# Patient Record
Sex: Male | Born: 1980 | Race: Black or African American | Hispanic: No | Marital: Single | Smoking: Former smoker
Health system: Southern US, Community
[De-identification: ages and names within clinical notes are randomized; demographics above are authoritative.]

## PROBLEM LIST (undated history)

## (undated) DIAGNOSIS — K219 Gastro-esophageal reflux disease without esophagitis: Secondary | ICD-10-CM

## (undated) DIAGNOSIS — K56609 Unspecified intestinal obstruction, unspecified as to partial versus complete obstruction: Secondary | ICD-10-CM

## (undated) DIAGNOSIS — W3400XA Accidental discharge from unspecified firearms or gun, initial encounter: Secondary | ICD-10-CM

## (undated) DIAGNOSIS — Y249XXA Unspecified firearm discharge, undetermined intent, initial encounter: Secondary | ICD-10-CM

## (undated) HISTORY — PX: SMALL INTESTINE SURGERY: SHX150

## (undated) HISTORY — PX: OTHER SURGICAL HISTORY: SHX169

---

## 2000-10-04 ENCOUNTER — Encounter: Payer: Self-pay | Admitting: Emergency Medicine

## 2000-10-04 ENCOUNTER — Observation Stay (HOSPITAL_COMMUNITY): Admission: EM | Admit: 2000-10-04 | Discharge: 2000-10-05 | Payer: Self-pay | Admitting: Emergency Medicine

## 2004-04-01 ENCOUNTER — Emergency Department (HOSPITAL_COMMUNITY): Admission: EM | Admit: 2004-04-01 | Discharge: 2004-04-01 | Payer: Self-pay | Admitting: Emergency Medicine

## 2004-10-05 ENCOUNTER — Emergency Department (HOSPITAL_COMMUNITY): Admission: EM | Admit: 2004-10-05 | Discharge: 2004-10-05 | Payer: Self-pay | Admitting: Emergency Medicine

## 2004-10-07 ENCOUNTER — Emergency Department (HOSPITAL_COMMUNITY): Admission: EM | Admit: 2004-10-07 | Discharge: 2004-10-07 | Payer: Self-pay | Admitting: Emergency Medicine

## 2005-02-11 ENCOUNTER — Emergency Department (HOSPITAL_COMMUNITY): Admission: EM | Admit: 2005-02-11 | Discharge: 2005-02-11 | Payer: Self-pay | Admitting: Emergency Medicine

## 2005-02-13 ENCOUNTER — Emergency Department (HOSPITAL_COMMUNITY): Admission: EM | Admit: 2005-02-13 | Discharge: 2005-02-13 | Payer: Self-pay | Admitting: Emergency Medicine

## 2005-02-15 ENCOUNTER — Emergency Department (HOSPITAL_COMMUNITY): Admission: EM | Admit: 2005-02-15 | Discharge: 2005-02-15 | Payer: Self-pay | Admitting: Emergency Medicine

## 2005-03-02 ENCOUNTER — Emergency Department (HOSPITAL_COMMUNITY): Admission: EM | Admit: 2005-03-02 | Discharge: 2005-03-02 | Payer: Self-pay | Admitting: Emergency Medicine

## 2005-03-21 ENCOUNTER — Emergency Department (HOSPITAL_COMMUNITY): Admission: EM | Admit: 2005-03-21 | Discharge: 2005-03-21 | Payer: Self-pay | Admitting: Emergency Medicine

## 2005-03-29 ENCOUNTER — Emergency Department (HOSPITAL_COMMUNITY): Admission: EM | Admit: 2005-03-29 | Discharge: 2005-03-29 | Payer: Self-pay | Admitting: Emergency Medicine

## 2010-04-13 ENCOUNTER — Emergency Department (HOSPITAL_COMMUNITY)
Admission: EM | Admit: 2010-04-13 | Discharge: 2010-04-13 | Payer: Self-pay | Source: Home / Self Care | Admitting: Emergency Medicine

## 2010-08-23 LAB — RAPID STREP SCREEN (MED CTR MEBANE ONLY): Streptococcus, Group A Screen (Direct): NEGATIVE

## 2013-05-25 ENCOUNTER — Emergency Department (HOSPITAL_COMMUNITY)
Admission: EM | Admit: 2013-05-25 | Discharge: 2013-05-25 | Disposition: A | Discharge status: Home / Self Care | Payer: Medicaid Other | Attending: Emergency Medicine | Admitting: Emergency Medicine

## 2013-05-25 ENCOUNTER — Encounter (HOSPITAL_COMMUNITY): Payer: Self-pay | Admitting: Emergency Medicine

## 2013-05-25 ENCOUNTER — Emergency Department (HOSPITAL_COMMUNITY): Payer: Medicaid Other

## 2013-05-25 DIAGNOSIS — Z9889 Other specified postprocedural states: Secondary | ICD-10-CM | POA: Insufficient documentation

## 2013-05-25 DIAGNOSIS — R1032 Left lower quadrant pain: Secondary | ICD-10-CM | POA: Insufficient documentation

## 2013-05-25 DIAGNOSIS — R112 Nausea with vomiting, unspecified: Secondary | ICD-10-CM | POA: Insufficient documentation

## 2013-05-25 DIAGNOSIS — Z87828 Personal history of other (healed) physical injury and trauma: Secondary | ICD-10-CM | POA: Insufficient documentation

## 2013-05-25 DIAGNOSIS — G8918 Other acute postprocedural pain: Secondary | ICD-10-CM | POA: Insufficient documentation

## 2013-05-25 DIAGNOSIS — Z79899 Other long term (current) drug therapy: Secondary | ICD-10-CM | POA: Insufficient documentation

## 2013-05-25 DIAGNOSIS — R1012 Left upper quadrant pain: Secondary | ICD-10-CM | POA: Insufficient documentation

## 2013-05-25 DIAGNOSIS — F17200 Nicotine dependence, unspecified, uncomplicated: Secondary | ICD-10-CM | POA: Insufficient documentation

## 2013-05-25 DIAGNOSIS — K5900 Constipation, unspecified: Secondary | ICD-10-CM | POA: Insufficient documentation

## 2013-05-25 HISTORY — DX: Unspecified intestinal obstruction, unspecified as to partial versus complete obstruction: K56.609

## 2013-05-25 HISTORY — DX: Accidental discharge from unspecified firearms or gun, initial encounter: W34.00XA

## 2013-05-25 HISTORY — DX: Unspecified firearm discharge, undetermined intent, initial encounter: Y24.9XXA

## 2013-05-25 LAB — CBC WITH DIFFERENTIAL/PLATELET
Basophils Absolute: 0 10*3/uL (ref 0.0–0.1)
Basophils Relative: 0 % (ref 0–1)
Eosinophils Absolute: 0 10*3/uL (ref 0.0–0.7)
Eosinophils Relative: 0 % (ref 0–5)
HCT: 43.4 % (ref 39.0–52.0)
Hemoglobin: 14.5 g/dL (ref 13.0–17.0)
MCH: 28.9 pg (ref 26.0–34.0)
MCHC: 33.4 g/dL (ref 30.0–36.0)
MCV: 86.6 fL (ref 78.0–100.0)
Monocytes Absolute: 0.6 10*3/uL (ref 0.1–1.0)
Monocytes Relative: 6 % (ref 3–12)
Platelets: 560 10*3/uL — ABNORMAL HIGH (ref 150–400)
RBC: 5.01 MIL/uL (ref 4.22–5.81)
RDW: 13.6 % (ref 11.5–15.5)

## 2013-05-25 LAB — COMPREHENSIVE METABOLIC PANEL
AST: 18 U/L (ref 0–37)
Albumin: 3.8 g/dL (ref 3.5–5.2)
BUN: 12 mg/dL (ref 6–23)
CO2: 31 mEq/L (ref 19–32)
Calcium: 9.5 mg/dL (ref 8.4–10.5)
Chloride: 99 mEq/L (ref 96–112)
Creatinine, Ser: 1.14 mg/dL (ref 0.50–1.35)
GFR calc Af Amer: >90 mL/min (ref >90)
GFR calc non Af Amer: 83 mL/min — ABNORMAL LOW (ref >90)
Total Bilirubin: 0.4 mg/dL (ref 0.3–1.2)
Total Protein: 8.3 g/dL (ref 6.0–8.3)

## 2013-05-25 LAB — URINALYSIS, ROUTINE W REFLEX MICROSCOPIC
Bilirubin Urine: NEGATIVE
Ketones, ur: NEGATIVE mg/dL
Leukocytes, UA: NEGATIVE
Nitrite: NEGATIVE
Protein, ur: NEGATIVE mg/dL
Specific Gravity, Urine: 1.015 (ref 1.005–1.030)
Urobilinogen, UA: 0.2 mg/dL (ref 0.0–1.0)
pH: 8.5 — ABNORMAL HIGH (ref 5.0–8.0)

## 2013-05-25 LAB — LIPASE, BLOOD: Lipase: 35 U/L (ref 11–59)

## 2013-05-25 LAB — LACTIC ACID, PLASMA: Lactic Acid, Venous: 1 mmol/L (ref 0.5–2.2)

## 2013-05-25 MED ORDER — OXYCODONE-ACETAMINOPHEN 5-325 MG PO TABS
2.0000 | ORAL_TABLET | Freq: Once | ORAL | Status: AC
  Administered 2013-05-25: 2 via ORAL
  Filled 2013-05-25: qty 2

## 2013-05-25 MED ORDER — IOHEXOL 300 MG/ML  SOLN
100.0000 mL | Freq: Once | INTRAMUSCULAR | Status: AC | PRN
  Administered 2013-05-25: 100 mL via INTRAVENOUS

## 2013-05-25 MED ORDER — ONDANSETRON HCL 4 MG PO TABS: 4.0000 mg | ORAL_TABLET | Freq: Four times a day (QID) | ORAL | Status: DC

## 2013-05-25 MED ORDER — ONDANSETRON HCL 4 MG/2ML IJ SOLN
4.0000 mg | Freq: Once | INTRAMUSCULAR | Status: AC
  Administered 2013-05-25: 4 mg via INTRAVENOUS
  Filled 2013-05-25: qty 2

## 2013-05-25 MED ORDER — TRAMADOL HCL 50 MG PO TABS: 50.0000 mg | ORAL_TABLET | Freq: Four times a day (QID) | ORAL | Status: DC | PRN

## 2013-05-25 MED ORDER — HYDROMORPHONE HCL PF 1 MG/ML IJ SOLN
1.0000 mg | Freq: Once | INTRAMUSCULAR | Status: AC
  Administered 2013-05-25: 1 mg via INTRAVENOUS
  Filled 2013-05-25: qty 1

## 2013-05-25 MED ORDER — SODIUM CHLORIDE 0.9 % IJ SOLN
INTRAMUSCULAR | Status: AC
  Administered 2013-05-25: 17:00:00
  Filled 2013-05-25: qty 250

## 2013-05-25 MED ORDER — SODIUM CHLORIDE 0.9 % IV BOLUS (SEPSIS)
1000.0000 mL | Freq: Once | INTRAVENOUS | Status: AC
  Administered 2013-05-25: 1000 mL via INTRAVENOUS

## 2013-05-25 MED ORDER — IOHEXOL 300 MG/ML  SOLN
50.0000 mL | Freq: Once | INTRAMUSCULAR | Status: AC | PRN
  Administered 2013-05-25: 50 mL via ORAL
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
## 2013-05-25 NOTE — ED Notes (Addendum)
Per mother patient had small bowel obsturuction in which he had surgery for on 12/3, since then patient has had severe left upper and lower pain with nausea and vomiting. Patient reports taking norco with no relief. Per patient normal BMs but has had bright red blood in emesis. Patient seen at [facility], had x-ray and enema, was discharged. Surgeon refused to help him any further after patient called office reporting the pain and vomiting.

## 2014-05-09 ENCOUNTER — Emergency Department (HOSPITAL_COMMUNITY): Payer: Medicaid Other

## 2014-05-09 ENCOUNTER — Emergency Department (HOSPITAL_COMMUNITY)
Admission: EM | Admit: 2014-05-09 | Discharge: 2014-05-09 | Disposition: A | Discharge status: Home / Self Care | Payer: Medicaid Other | Attending: Emergency Medicine | Admitting: Emergency Medicine

## 2014-05-09 ENCOUNTER — Encounter (HOSPITAL_COMMUNITY): Payer: Self-pay | Admitting: Emergency Medicine

## 2014-05-09 DIAGNOSIS — Z8719 Personal history of other diseases of the digestive system: Secondary | ICD-10-CM | POA: Insufficient documentation

## 2014-05-09 DIAGNOSIS — R197 Diarrhea, unspecified: Secondary | ICD-10-CM | POA: Insufficient documentation

## 2014-05-09 DIAGNOSIS — R1084 Generalized abdominal pain: Secondary | ICD-10-CM | POA: Diagnosis not present

## 2014-05-09 DIAGNOSIS — Z9889 Other specified postprocedural states: Secondary | ICD-10-CM | POA: Insufficient documentation

## 2014-05-09 DIAGNOSIS — R109 Unspecified abdominal pain: Secondary | ICD-10-CM | POA: Diagnosis present

## 2014-05-09 DIAGNOSIS — R110 Nausea: Secondary | ICD-10-CM | POA: Diagnosis not present

## 2014-05-09 DIAGNOSIS — Z87828 Personal history of other (healed) physical injury and trauma: Secondary | ICD-10-CM | POA: Diagnosis not present

## 2014-05-09 DIAGNOSIS — Z79899 Other long term (current) drug therapy: Secondary | ICD-10-CM | POA: Diagnosis not present

## 2014-05-09 DIAGNOSIS — Z720 Tobacco use: Secondary | ICD-10-CM | POA: Diagnosis not present

## 2014-05-09 LAB — URINALYSIS, ROUTINE W REFLEX MICROSCOPIC
Bilirubin Urine: NEGATIVE
GLUCOSE, UA: NEGATIVE mg/dL
Hgb urine dipstick: NEGATIVE
KETONES UR: NEGATIVE mg/dL
LEUKOCYTES UA: NEGATIVE
Nitrite: NEGATIVE
Protein, ur: NEGATIVE mg/dL
SPECIFIC GRAVITY, URINE: 1.02 (ref 1.005–1.030)
Urobilinogen, UA: 0.2 mg/dL (ref 0.0–1.0)
pH: 7.5 (ref 5.0–8.0)

## 2014-05-09 LAB — BASIC METABOLIC PANEL
Anion gap: 10 (ref 5–15)
BUN: 13 mg/dL (ref 6–23)
CHLORIDE: 101 meq/L (ref 96–112)
CO2: 27 meq/L (ref 19–32)
Calcium: 9.2 mg/dL (ref 8.4–10.5)
Creatinine, Ser: 1.12 mg/dL (ref 0.50–1.35)
GFR calc Af Amer: >90 mL/min (ref >90)
GFR calc non Af Amer: 84 mL/min — ABNORMAL LOW (ref >90)
Glucose, Bld: 88 mg/dL (ref 70–99)
POTASSIUM: 4.3 meq/L (ref 3.7–5.3)
SODIUM: 138 meq/L (ref 137–147)

## 2014-05-09 LAB — CBC WITH DIFFERENTIAL/PLATELET
Basophils Absolute: 0.1 10*3/uL (ref 0.0–0.1)
Basophils Relative: 1 % (ref 0–1)
EOS PCT: 5 % (ref 0–5)
Eosinophils Absolute: 0.3 10*3/uL (ref 0.0–0.7)
HEMATOCRIT: 42.7 % (ref 39.0–52.0)
HEMOGLOBIN: 14.2 g/dL (ref 13.0–17.0)
Lymphocytes Relative: 37 % (ref 12–46)
Lymphs Abs: 2.3 10*3/uL (ref 0.7–4.0)
MCH: 29.1 pg (ref 26.0–34.0)
MCHC: 33.3 g/dL (ref 30.0–36.0)
MCV: 87.5 fL (ref 78.0–100.0)
MONO ABS: 0.6 10*3/uL (ref 0.1–1.0)
Monocytes Relative: 10 % (ref 3–12)
Neutro Abs: 2.9 10*3/uL (ref 1.7–7.7)
Neutrophils Relative %: 47 % (ref 43–77)
Platelets: 278 10*3/uL (ref 150–400)
RBC: 4.88 MIL/uL (ref 4.22–5.81)
RDW: 14.2 % (ref 11.5–15.5)
WBC: 6.1 10*3/uL (ref 4.0–10.5)

## 2014-05-09 MED ORDER — HYDROCODONE-ACETAMINOPHEN 5-325 MG PO TABS: 1.0000 | ORAL_TABLET | ORAL | Status: DC | PRN

## 2014-05-09 MED ORDER — SODIUM CHLORIDE 0.9 % IV BOLUS (SEPSIS)
1000.0000 mL | Freq: Once | INTRAVENOUS | Status: AC
  Administered 2014-05-09: 1000 mL via INTRAVENOUS

## 2014-05-09 MED ORDER — IOHEXOL 300 MG/ML  SOLN
100.0000 mL | Freq: Once | INTRAMUSCULAR | Status: AC | PRN
  Administered 2014-05-09: 100 mL via INTRAVENOUS

## 2014-05-09 MED ORDER — ONDANSETRON HCL 4 MG/2ML IJ SOLN
4.0000 mg | Freq: Once | INTRAMUSCULAR | Status: AC
  Administered 2014-05-09: 4 mg via INTRAVENOUS
  Filled 2014-05-09: qty 2

## 2014-05-09 MED ORDER — HYDROMORPHONE HCL 1 MG/ML IJ SOLN
1.0000 mg | Freq: Once | INTRAMUSCULAR | Status: AC
  Administered 2014-05-09: 1 mg via INTRAVENOUS
  Filled 2014-05-09: qty 1

## 2014-05-09 MED ORDER — IOHEXOL 300 MG/ML  SOLN
50.0000 mL | Freq: Once | INTRAMUSCULAR | Status: AC | PRN
  Administered 2014-05-09: 50 mL via ORAL

## 2014-05-09 NOTE — ED Provider Notes (Signed)
17:00- patient is comfortable at this time.  I discussed the CT findings with him.  Questionable referral to primary care, and is given appropriate discharge instructions.  Results for orders placed or performed during the hospital encounter of 05/09/14  Basic metabolic panel  Result Value Ref Range   Sodium 138 137 - 147 mEq/L   Potassium 4.3 3.7 - 5.3 mEq/L   Chloride 101 96 - 112 mEq/L   CO2 27 19 - 32 mEq/L   Glucose, Bld 88 70 - 99 mg/dL   BUN 13 6 - 23 mg/dL   Creatinine, Ser 1.611.12 0.50 - 1.35 mg/dL   Calcium 9.2 8.4 - 09.610.5 mg/dL   GFR calc non Af Amer 84 (L) >90 mL/min   GFR calc Af Amer >90 >90 mL/min   Anion gap 10 5 - 15  CBC with Differential  Result Value Ref Range   WBC 6.1 4.0 - 10.5 K/uL   RBC 4.88 4.22 - 5.81 MIL/uL   Hemoglobin 14.2 13.0 - 17.0 g/dL   HCT 04.542.7 40.939.0 - 81.152.0 %   MCV 87.5 78.0 - 100.0 fL   MCH 29.1 26.0 - 34.0 pg   MCHC 33.3 30.0 - 36.0 g/dL   RDW 91.414.2 78.211.5 - 95.615.5 %   Platelets 278 150 - 400 K/uL   Neutrophils Relative % 47 43 - 77 %   Neutro Abs 2.9 1.7 - 7.7 K/uL   Lymphocytes Relative 37 12 - 46 %   Lymphs Abs 2.3 0.7 - 4.0 K/uL   Monocytes Relative 10 3 - 12 %   Monocytes Absolute 0.6 0.1 - 1.0 K/uL   Eosinophils Relative 5 0 - 5 %   Eosinophils Absolute 0.3 0.0 - 0.7 K/uL   Basophils Relative 1 0 - 1 %   Basophils Absolute 0.1 0.0 - 0.1 K/uL  Urinalysis, Routine w reflex microscopic  Result Value Ref Range   Color, Urine YELLOW YELLOW   APPearance CLEAR CLEAR   Specific Gravity, Urine 1.020 1.005 - 1.030   pH 7.5 5.0 - 8.0   Glucose, UA NEGATIVE NEGATIVE mg/dL   Hgb urine dipstick NEGATIVE NEGATIVE   Bilirubin Urine NEGATIVE NEGATIVE   Ketones, ur NEGATIVE NEGATIVE mg/dL   Protein, ur NEGATIVE NEGATIVE mg/dL   Urobilinogen, UA 0.2 0.0 - 1.0 mg/dL   Nitrite NEGATIVE NEGATIVE   Leukocytes, UA NEGATIVE NEGATIVE    Ct Abdomen Pelvis W Contrast  05/09/2014   CLINICAL DATA:  33 year old with recurrent and severe abdominal pain. Nausea  and loose stools.  EXAM: CT ABDOMEN AND PELVIS WITH CONTRAST  TECHNIQUE: Multidetector CT imaging of the abdomen and pelvis was performed using the standard protocol following bolus administration of intravenous contrast.  CONTRAST:  50mL OMNIPAQUE IOHEXOL 300 MG/ML SOLN, 100mL OMNIPAQUE IOHEXOL 300 MG/ML SOLN  COMPARISON:  04/22/2014  FINDINGS: Lung bases are clear.  No evidence for free air.  Normal appearance of the liver, portal venous system and gallbladder. Normal appearance of the spleen and pancreas. Normal appearance of the adrenal glands. Two small stones in the right kidney upper pole without hydronephrosis. Largest stone roughly measures 3 mm. Small cyst along the right kidney upper pole. Two small stones in the left kidney mid pole without hydronephrosis. No significant free fluid or lymphadenopathy. There is no evidence for a bowel obstruction. Few calcifications in the prostate. Fluid in the urinary bladder. Surgical bowel clips in the left upper abdomen.  No acute bone abnormality. Disc space narrowing and mild retrolisthesis at L5-S1.  IMPRESSION: No acute abnormality within the abdomen or pelvis.  Nonobstructive bilateral kidney stones.   Electronically Signed   By: Richarda OverlieAdam  Henn M.D.   On: 05/09/2014 16:28    Nursing Notes Reviewed/ Care Coordinated Applicable Imaging Reviewed Interpretation of Laboratory Data incorporated into ED treatment  The patient appears reasonably screened and/or stabilized for discharge and I doubt any other medical condition or other [facility] requiring further screening, evaluation, or treatment in the ED at this time prior to discharge.  Plan: Home Medications- Norco; Home Treatments- rest, OOW 3 days; return here if the recommended treatment, does not improve the symptoms; Recommended follow up- PCP prn  Flint MelterElliott L Viktor Philipp, MD 05/09/14 1705

## 2014-05-09 NOTE — Discharge Instructions (Signed)
Use the resource guide, to find a primary care doctor for ongoing medical care.    Abdominal Pain Many things can cause abdominal pain. Usually, abdominal pain is not caused by a disease and will improve without treatment. It can often be observed and treated at home. Your health care provider will do a physical exam and possibly order blood tests and X-rays to help determine the seriousness of your pain. However, in many cases, more time must pass before a clear cause of the pain can be found. Before that point, your health care provider may not know if you need more testing or further treatment. HOME CARE INSTRUCTIONS  Monitor your abdominal pain for any changes. The following actions may help to alleviate any discomfort you are experiencing:  Only take over-the-counter or prescription medicines as directed by your health care provider.  Do not take laxatives unless directed to do so by your health care provider.  Try a clear liquid diet (broth, tea, or water) as directed by your health care provider. Slowly move to a bland diet as tolerated. SEEK MEDICAL CARE IF:  You have unexplained abdominal pain.  You have abdominal pain associated with nausea or diarrhea.  You have pain when you urinate or have a bowel movement.  You experience abdominal pain that wakes you in the night.  You have abdominal pain that is worsened or improved by eating food.  You have abdominal pain that is worsened with eating fatty foods.  You have a fever. SEEK IMMEDIATE MEDICAL CARE IF:   Your pain does not go away within 2 hours.  You keep throwing up (vomiting).  Your pain is felt only in portions of the abdomen, such as the right side or the left lower portion of the abdomen.  You pass bloody or black tarry stools. MAKE SURE YOU:  Understand these instructions.   Will watch your condition.   Will get help right away if you are not doing well or get worse.  Document Released: 03/08/2005  Document Revised: 06/03/2013 Document Reviewed: 02/05/2013 [facility] Patient Information 2015 [place], [place]. This information is not intended to replace advice given to you by your health care provider. Make sure you discuss any questions you have with your health care provider.    Emergency Department Resource Guide 1) Find a Doctor and Pay Out of Pocket Although you won't have to find out who is covered by your insurance plan, it is a good idea to ask around and get recommendations. You will then need to call the office and see if the doctor you have chosen will accept you as a new patient and what types of options they offer for patients who are self-pay. Some doctors offer discounts or will set up payment plans for their patients who do not have insurance, but you will need to ask so you aren't surprised when you get to your appointment.  2) Contact Your Local Health Department Not all health departments have doctors that can see patients for sick visits, but many do, so it is worth a call to see if yours does. If you don't know where your local health department is, you can check in your phone book. The CDC also has a tool to help you locate your state's health department, and many state websites also have listings of all of their local health departments.  3) Find a Walk-in Clinic If your illness is not likely to be very severe or complicated, you may want to try a  walk in clinic. These are popping up all over the country in pharmacies, drugstores, and shopping centers. They're usually staffed by nurse practitioners or physician assistants that have been trained to treat common illnesses and complaints. They're usually fairly quick and inexpensive. However, if you have serious medical issues or chronic medical problems, these are probably not your best option.  No Primary Care Doctor: - Call Health Connect at  [phone] - they can help you locate a primary care doctor that  accepts your  insurance, provides certain services, etc. - Physician Referral Service- [phone]  Chronic Pain Problems: Organization         Address  Phone   Notes  [facility]  [phone] Patients need to be referred by their primary care doctor.   Medication Assistance: Organization         Address  Phone   Notes  [facility] Med[facility] [address] [place], [place] [address] [phone] --Must be a resident of [facility] -- Must have NO insurance coverage whatsoever (no Medicaid/ Medicare, etc.) -- The pt. MUST have a primary care doctor that directs their care regularly and follows them in the community   MedAssist  [phone]   [employer]  [phone]    Agencies that provide inexpensive medical care: Organization         Address  Phone   Notes  [place]  [phone]   [facility]    [phone]   [facility] [place], [place] [address] [phone]   [place] [address], [place] [phone]   Planned Parenthood    [phone]   [facility]    [phone]   [place] and [place] [address], [place] Phone:  [phone], Fax:  [phone] Hours of Operation:  9 am - 6 pm, M-F.  Also accepts Medicaid/Medicare and self-pay.  [facility] for [place], [address], [place] Phone: [phone], Fax: [phone]. Hours of Operation:  8:30 am - 5:30 pm, M-F.  Also accepts Medicaid and self-pay.  [facility] [address] [place] Phone: [phone]   [place], [place], [place] [phone], Ext. [phone] Mondays & Thursdays: 7-9 AM.  First 15 patients are seen on a first come, first serve basis.    [place] Providers:  Organization          Address  Phone   Notes  [facility] [address], [place] [phone] Also accepts self-pay patients.  [facility] [id number] [place], [place]  [phone]   [place], [address], [place] [phone]   [facility] [address], [place] [phone]   Lucianne Lei [address], [place]   [phone] Only accepts [place] Access [place] patients after they have their name applied to their card.   Self-Pay (no insurance) in [facility]:  Organization         Address  Phone   Notes  Sickle Cell Patients, [facility] [place] [phone]   [facility] [place] [phone]   Gershon Mussel  [facility]  [place], [address],  [phone]   [facility]/Dr. Osei-Bonsu  [address], [place] or [address], [place] [phone] Phone number for both [place] and [place] locations is the same.  Urgent Medical and [facility] [address] [place] [phone]   [facility] [address], [place] or [address] [phone]   [facility] [address], [place] [phone], phone; [phone], fax Sees patients 1st and 3rd Saturday of every month.  Must not qualify for public or private insurance (i.e. Medicaid, Medicare, [place] Health Choice, Veterans' Benefits)  Household income should be no more than 200% of the poverty level The clinic cannot treat you if you are pregnant or think you are pregnant  Sexually transmitted diseases are not treated at the clinic.    Dental Care: Organization         Address  Phone  Notes  [facility] of [facility] [place] [phone] Accepts children up to age 48 who are enrolled in [place] or [place]; pregnant women with a Medicaid card; and children who have applied for Medicaid or [place] Health Choice, but were declined, whose parents can pay a reduced fee at time of service.  [facility] of [facility]  [address], [place] [phone] Accepts children up to age 19 who are enrolled in [place] or [place]; pregnant women with a Medicaid card; and children who have applied for Medicaid or [place] Health Choice, but were declined, whose parents can pay a reduced fee at time of service.  [place] Adult [facility] Access PROGRAM  [place] [phone] Patients are seen by appointment only. Walk-ins are not accepted. [place] will see patients 59 years of age and older. Monday - Tuesday (8am-5pm) Most Wednesdays (8:30-5pm) $30 per visit, cash only  [facility] Adult Dental Access PROGRAM  [address], [facility] [phone] Patients are seen by appointment only. Walk-ins are not accepted. [place] will see patients 48 years of age and older. One Wednesday Evening (Monthly: Volunteer Based).  $30 per visit, cash only  [place]  [phone] for adults; Children under age 73, call Graduate Pediatric Dentistry at [phone]. Children aged 38-14, please call [phone] to request a pediatric application.  Dental services are provided in all areas of dental care including fillings, crowns and bridges, complete and partial dentures, implants, gum treatment, root canals, and extractions. Preventive care is also provided. Treatment is provided to both adults and children. Patients are selected via a lottery and there is often a waiting list.   [facility] [address], [place]  [phone] [email or web address]   [facility] [address] [place], [place]  [phone], Ext. [phone] Second and Fourth Thursday of each month, opens at 6:30 AM; Clinic ends at 9 AM.  Patients are seen on a first-come first-served basis, and a limited number are seen during each clinic.   [facility]  [address] Hillard Danker [place], [place] [phone]   Eligibility Requirements You must have lived in [place], [place], or [place] counties for at least the last three months.   You cannot be eligible for state or federal sponsored healthcare  insurance, including [employer], [place], or [employer].   You generally cannot be eligible for healthcare insurance through your employer.    How to apply: Eligibility screenings are held every Tuesday and Wednesday afternoon from 1:00 pm until 4:00 pm. You do not need an appointment for the interview!  [facility] [address], [place], [place]   [place]  [place] Department  [place]  [phone]    Behavioral Health Resources in the Community: Intensive Outpatient Programs Organization         Address  Phone  Notes  [place]. [address] [place], [place] [phone]   [facility] Outpatient [address], [place], [place]   ADS: Alcohol & Drug Svcs [address], [place], [place]   [place] [address]  [place], [place] or [phone]   Substance Abuse Resources Organization         Address  Phone  Notes  Alcohol and Drug Services  [phone]   [place]  [phone]   The [place]   Chinita Pester  [phone]   Residential & Outpatient Substance Abuse Program  [phone]   Psychological Services Organization         Address  Phone  Notes  [facility] [place]  [place]  [phone]    [place] [address], [place] or [phone]    Mobile Crisis Teams Organization         Address  Phone  Notes  [facility], Mobile Crisis Care Unit  [phone]   Assertive Psychotherapeutic Services  [address] [place], [place]   [place] Levels [address], [place] [phone]    Self-Help/Support Groups Organization         Address  Phone             Notes  [place]. of [place] - variety of support groups  [place] Call for more information  Narcotics Anonymous (NA), [facility]aring [facility] [address], [employer] [place]  2 meetings at this location   Special educational needs teacher         Address  Phone  Notes  ASAP Residential Treatment [place],    [place]  [phone]   [facility]  [address] [place] [address]27741, [place], [place]   [place], [place] [phone] Admissions: 8am-3pm M-F  Incentives Substance [place] [address]    [place], [place] [phone]   [facility] [address] [place], [place], [place]   [facility] [address],  [place], [place]   [facility]ight Programs - Intensive Outpatient [address] Kristeen Mans [address], [place], [facility]   [facility] [address] [place] [place].,  [place], [place] [phone] or [phone]   [facility]idential Treatment Services (RTS) [address] [place], [place] Accepts Medicaid  [place] [address]  [place] [phone] Substance Abuse/Addiction Treatment   [facility] Organization         Address  Phone  Notes  [facility]  [phone]   Domenic Schwab, PhD [address] [place], [place]   [phone] or [phone])  [phone]   [facility]   [address] [place], [place] [phone]   [facility] [address], [place], [place] [phone] Insurance/Medicaid/sponsorship through [facility]  Faith and Families [address]                                    [place], [place] [phone] Therapy/tele-psych/case  [facility] [address]   [place], [place] [phone]) [phone]    Dr. Lolly MustacheArfeen  [phone]   [facility] [place] [facility]  [employer] [facility] 1) [address], [place] 2) [address], [place] 3)  [address], [place] [phone]) [phone]  [phone](336) [phone]   [facility] Child Abuse Hotline [phone]) [phone] or [phone]) [phone] (After Hours)

## 2014-05-09 NOTE — ED Notes (Signed)
Patient with no complaints at this time. Respirations even and unlabored. Skin warm/dry. Discharge instructions reviewed with patient at this time. Patient given opportunity to voice concerns/ask questions. IV removed per policy and band-aid applied to site. Patient discharged at this time and left Emergency Department with steady gait.  
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
## 2015-09-07 ENCOUNTER — Encounter (HOSPITAL_COMMUNITY): Payer: Self-pay | Admitting: Emergency Medicine

## 2015-09-07 DIAGNOSIS — R112 Nausea with vomiting, unspecified: Secondary | ICD-10-CM | POA: Insufficient documentation

## 2015-09-07 DIAGNOSIS — Z8719 Personal history of other diseases of the digestive system: Secondary | ICD-10-CM | POA: Diagnosis not present

## 2015-09-07 DIAGNOSIS — R1084 Generalized abdominal pain: Secondary | ICD-10-CM | POA: Insufficient documentation

## 2015-09-07 DIAGNOSIS — Z9889 Other specified postprocedural states: Secondary | ICD-10-CM | POA: Insufficient documentation

## 2015-09-07 DIAGNOSIS — E860 Dehydration: Secondary | ICD-10-CM | POA: Insufficient documentation

## 2015-09-07 DIAGNOSIS — Z87828 Personal history of other (healed) physical injury and trauma: Secondary | ICD-10-CM | POA: Insufficient documentation

## 2015-09-07 DIAGNOSIS — F17210 Nicotine dependence, cigarettes, uncomplicated: Secondary | ICD-10-CM | POA: Diagnosis not present

## 2015-09-07 LAB — URINE MICROSCOPIC-ADD ON

## 2015-09-07 LAB — URINALYSIS, ROUTINE W REFLEX MICROSCOPIC
Bilirubin Urine: NEGATIVE
GLUCOSE, UA: NEGATIVE mg/dL
Ketones, ur: NEGATIVE mg/dL
Leukocytes, UA: NEGATIVE
Nitrite: NEGATIVE
PH: 8.5 — AB (ref 5.0–8.0)
PROTEIN: 30 mg/dL — AB
Specific Gravity, Urine: 1.022 (ref 1.005–1.030)

## 2015-09-07 LAB — COMPREHENSIVE METABOLIC PANEL
ALT: 20 U/L (ref 17–63)
AST: 40 U/L (ref 15–41)
Albumin: 4.2 g/dL (ref 3.5–5.0)
Alkaline Phosphatase: 82 U/L (ref 38–126)
Anion gap: 11 (ref 5–15)
BUN: 7 mg/dL (ref 6–20)
CHLORIDE: 105 mmol/L (ref 101–111)
CO2: 24 mmol/L (ref 22–32)
CREATININE: 1.03 mg/dL (ref 0.61–1.24)
Calcium: 9.5 mg/dL (ref 8.9–10.3)
GFR calc non Af Amer: >60 mL/min (ref >60)
Glucose, Bld: 130 mg/dL — ABNORMAL HIGH (ref 65–99)
Potassium: 4.1 mmol/L (ref 3.5–5.1)
SODIUM: 140 mmol/L (ref 135–145)
Total Bilirubin: 0.9 mg/dL (ref 0.3–1.2)
Total Protein: 7.6 g/dL (ref 6.5–8.1)

## 2015-09-07 LAB — CBC
HCT: 43.9 % (ref 39.0–52.0)
Hemoglobin: 15.2 g/dL (ref 13.0–17.0)
MCH: 29.6 pg (ref 26.0–34.0)
MCHC: 34.6 g/dL (ref 30.0–36.0)
MCV: 85.4 fL (ref 78.0–100.0)
PLATELETS: 219 10*3/uL (ref 150–400)
RBC: 5.14 MIL/uL (ref 4.22–5.81)
RDW: 14.2 % (ref 11.5–15.5)
WBC: 14.2 10*3/uL — ABNORMAL HIGH (ref 4.0–10.5)

## 2015-09-07 LAB — LIPASE, BLOOD: LIPASE: 20 U/L (ref 11–51)

## 2015-09-07 MED ORDER — FENTANYL CITRATE (PF) 100 MCG/2ML IJ SOLN
INTRAMUSCULAR | Status: AC
Start: 2015-09-07 — End: 2015-09-08
  Administered 2015-09-08: 50 ug via INTRAVENOUS
  Filled 2015-09-07: qty 2

## 2015-09-07 MED ORDER — ONDANSETRON HCL 4 MG/2ML IJ SOLN
4.0000 mg | Freq: Once | INTRAMUSCULAR | Status: AC | PRN
  Administered 2015-09-07: 4 mg via INTRAVENOUS

## 2015-09-07 MED ORDER — FENTANYL CITRATE (PF) 100 MCG/2ML IJ SOLN
50.0000 ug | INTRAMUSCULAR | Status: AC | PRN
  Administered 2015-09-07 – 2015-09-08 (×2): 50 ug via INTRAVENOUS
  Filled 2015-09-07: qty 2

## 2015-09-07 MED ORDER — ONDANSETRON HCL 4 MG/2ML IJ SOLN
INTRAMUSCULAR | Status: AC
  Filled 2015-09-07: qty 2

## 2015-09-07 NOTE — ED Notes (Signed)
Pt. reports low abdominal pain with emesis onset this morning , denies diarrhea , no fever or chills.

## 2015-09-08 ENCOUNTER — Emergency Department (HOSPITAL_COMMUNITY): Payer: Medicaid Other

## 2015-09-08 ENCOUNTER — Emergency Department (HOSPITAL_COMMUNITY)
Admission: EM | Admit: 2015-09-08 | Discharge: 2015-09-08 | Disposition: A | Discharge status: Home / Self Care | Payer: Medicaid Other | Attending: Emergency Medicine | Admitting: Emergency Medicine

## 2015-09-08 DIAGNOSIS — E860 Dehydration: Secondary | ICD-10-CM

## 2015-09-08 DIAGNOSIS — R1084 Generalized abdominal pain: Secondary | ICD-10-CM

## 2015-09-08 DIAGNOSIS — R112 Nausea with vomiting, unspecified: Secondary | ICD-10-CM

## 2015-09-08 MED ORDER — SODIUM CHLORIDE 0.9 % IV BOLUS (SEPSIS)
1000.0000 mL | Freq: Once | INTRAVENOUS | Status: AC
  Administered 2015-09-08: 1000 mL via INTRAVENOUS

## 2015-09-08 MED ORDER — ONDANSETRON 4 MG PO TBDP
8.0000 mg | ORAL_TABLET | Freq: Once | ORAL | Status: AC
  Administered 2015-09-08: 8 mg via ORAL

## 2015-09-08 MED ORDER — FENTANYL CITRATE (PF) 100 MCG/2ML IJ SOLN
50.0000 ug | Freq: Once | INTRAMUSCULAR | Status: DC
  Filled 2015-09-08: qty 2

## 2015-09-08 MED ORDER — ONDANSETRON 4 MG PO TBDP
ORAL_TABLET | ORAL | Status: AC
  Filled 2015-09-08: qty 2

## 2015-09-08 MED ORDER — ONDANSETRON HCL 4 MG PO TABS: 4.0000 mg | ORAL_TABLET | Freq: Three times a day (TID) | ORAL | Status: DC | PRN

## 2015-09-08 MED ORDER — DICYCLOMINE HCL 20 MG PO TABS: 20.0000 mg | ORAL_TABLET | Freq: Three times a day (TID) | ORAL | Status: DC

## 2015-09-08 MED ORDER — IOPAMIDOL (ISOVUE-300) INJECTION 61%
INTRAVENOUS | Status: AC
  Administered 2015-09-08: 100 mL
  Filled 2015-09-08: qty 100

## 2015-09-08 MED ORDER — METOCLOPRAMIDE HCL 5 MG/ML IJ SOLN
10.0000 mg | Freq: Once | INTRAMUSCULAR | Status: AC
  Administered 2015-09-08: 10 mg via INTRAVENOUS
  Filled 2015-09-08: qty 2

## 2015-09-08 MED ORDER — DIPHENHYDRAMINE HCL 50 MG/ML IJ SOLN
25.0000 mg | Freq: Once | INTRAMUSCULAR | Status: AC
Start: 2015-09-08 — End: 2015-09-08
  Administered 2015-09-08: 25 mg via INTRAVENOUS
  Filled 2015-09-08: qty 1

## 2015-09-08 MED ORDER — DICYCLOMINE HCL 10 MG/ML IM SOLN
20.0000 mg | Freq: Once | INTRAMUSCULAR | Status: AC
  Administered 2015-09-08: 20 mg via INTRAMUSCULAR
  Filled 2015-09-08: qty 2
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
## 2015-09-08 NOTE — Discharge Instructions (Signed)
Drink plenty of fluids (clear liquids) this morning then if doing better start a bland diet such as toast, Jell-O, or crackers, or [employer] chicken noodle soup. Avoid fried, spicy, or greasy foods for the next week.. Use the zofran for nausea or vomiting. Take the Bentyl for your abdominal pain. Take imodium OTC for diarrhea if needed. Avoid mild products until the diarrhea is gone. Recheck if you get worse.   Abdominal Pain, Adult Many things can cause belly (abdominal) pain. Most times, the belly pain is not dangerous. Many cases of belly pain can be watched and treated at home. HOME CARE   Do not take medicines that help you go poop (laxatives) unless told to by your doctor.  Only take medicine as told by your doctor.  Eat or drink as told by your doctor. Your doctor will tell you if you should be on a special diet. GET HELP IF:  You do not know what is causing your belly pain.  You have belly pain while you are sick to your stomach (nauseous) or have runny poop (diarrhea).  You have pain while you pee or poop.  Your belly pain wakes you up at night.  You have belly pain that gets worse or better when you eat.  You have belly pain that gets worse when you eat fatty foods.  You have a fever. GET HELP RIGHT AWAY IF:   The pain does not go away within 2 hours.  You keep throwing up (vomiting).  The pain changes and is only in the right or left part of the belly.  You have bloody or tarry looking poop. MAKE SURE YOU:   Understand these instructions.  Will watch your condition.  Will get help right away if you are not doing well or get worse.   This information is not intended to replace advice given to you by your health care provider. Make sure you discuss any questions you have with your health care provider.   Document Released: 11/15/2007 Document Revised: 06/19/2014 Document Reviewed: 02/05/2013 Elsevier Interactive Patient Education 2016 [employer]  Nausea  and Vomiting Nausea means you feel sick to your stomach. Throwing up (vomiting) is a reflex where stomach contents come out of your mouth. HOME CARE   Take medicine as told by your doctor.  Do not force yourself to eat. However, you do need to drink fluids.  If you feel like eating, eat a normal diet as told by your doctor.  Eat rice, wheat, potatoes, bread, lean meats, yogurt, fruits, and vegetables.  Avoid high-fat foods.  Drink enough fluids to keep your pee (urine) clear or pale yellow.  Ask your doctor how to replace body fluid losses (rehydrate). Signs of body fluid loss (dehydration) include:  Feeling very thirsty.  Dry lips and mouth.  Feeling dizzy.  Dark pee.  Peeing less than normal.  Feeling confused.  Fast breathing or heart rate. GET HELP RIGHT AWAY IF:   You have blood in your throw up.  You have black or bloody poop (stool).  You have a bad headache or stiff neck.  You feel confused.  You have bad belly (abdominal) pain.  You have chest pain or trouble breathing.  You do not pee at least once every 8 hours.  You have cold, clammy skin.  You keep throwing up after 24 to 48 hours.  You have a fever. MAKE SURE YOU:   Understand these instructions.  Will watch your condition.  Will get help  right away if you are not doing well or get worse.   This information is not intended to replace advice given to you by your health care provider. Make sure you discuss any questions you have with your health care provider.   Document Released: 11/15/2007 Document Revised: 08/21/2011 Document Reviewed: 10/28/2010 Elsevier Interactive Patient Education [employer]

## 2015-09-09 ENCOUNTER — Emergency Department (HOSPITAL_COMMUNITY): Payer: Medicaid Other

## 2015-09-09 ENCOUNTER — Encounter (HOSPITAL_COMMUNITY): Payer: Self-pay | Admitting: Emergency Medicine

## 2015-09-09 ENCOUNTER — Emergency Department (HOSPITAL_COMMUNITY)
Admission: EM | Admit: 2015-09-09 | Discharge: 2015-09-09 | Disposition: A | Discharge status: Home / Self Care | Payer: Medicaid Other | Attending: Emergency Medicine | Admitting: Emergency Medicine

## 2015-09-09 DIAGNOSIS — R112 Nausea with vomiting, unspecified: Secondary | ICD-10-CM | POA: Insufficient documentation

## 2015-09-09 DIAGNOSIS — R1084 Generalized abdominal pain: Secondary | ICD-10-CM | POA: Diagnosis not present

## 2015-09-09 DIAGNOSIS — Z79899 Other long term (current) drug therapy: Secondary | ICD-10-CM | POA: Insufficient documentation

## 2015-09-09 DIAGNOSIS — F17210 Nicotine dependence, cigarettes, uncomplicated: Secondary | ICD-10-CM | POA: Diagnosis not present

## 2015-09-09 DIAGNOSIS — R109 Unspecified abdominal pain: Secondary | ICD-10-CM

## 2015-09-09 LAB — CBC
HCT: 45 % (ref 39.0–52.0)
Hemoglobin: 15.5 g/dL (ref 13.0–17.0)
MCH: 29.5 pg (ref 26.0–34.0)
MCHC: 34.4 g/dL (ref 30.0–36.0)
MCV: 85.7 fL (ref 78.0–100.0)
Platelets: 239 K/uL (ref 150–400)
RBC: 5.25 MIL/uL (ref 4.22–5.81)
RDW: 14.1 % (ref 11.5–15.5)
WBC: 16.1 K/uL — ABNORMAL HIGH (ref 4.0–10.5)

## 2015-09-09 LAB — COMPREHENSIVE METABOLIC PANEL WITH GFR
ALT: 21 U/L (ref 17–63)
AST: 32 U/L (ref 15–41)
Albumin: 4.4 g/dL (ref 3.5–5.0)
Alkaline Phosphatase: 76 U/L (ref 38–126)
Anion gap: 10 (ref 5–15)
BUN: 12 mg/dL (ref 6–20)
CO2: 26 mmol/L (ref 22–32)
Calcium: 9.1 mg/dL (ref 8.9–10.3)
Chloride: 99 mmol/L — ABNORMAL LOW (ref 101–111)
Creatinine, Ser: 1.04 mg/dL (ref 0.61–1.24)
GFR calc Af Amer: >60 mL/min (ref >60)
GFR calc non Af Amer: >60 mL/min (ref >60)
Glucose, Bld: 118 mg/dL — ABNORMAL HIGH (ref 65–99)
Potassium: 3.7 mmol/L (ref 3.5–5.1)
Sodium: 135 mmol/L (ref 135–145)
Total Bilirubin: 1.3 mg/dL — ABNORMAL HIGH (ref 0.3–1.2)
Total Protein: 8 g/dL (ref 6.5–8.1)

## 2015-09-09 LAB — LACTIC ACID, PLASMA: LACTIC ACID, VENOUS: 1.6 mmol/L (ref 0.5–2.0)

## 2015-09-09 LAB — LIPASE, BLOOD: LIPASE: 44 U/L (ref 11–51)

## 2015-09-09 MED ORDER — SODIUM CHLORIDE 0.9 % IV BOLUS (SEPSIS)
1000.0000 mL | Freq: Once | INTRAVENOUS | Status: AC
  Administered 2015-09-09: 1000 mL via INTRAVENOUS

## 2015-09-09 MED ORDER — PROCHLORPERAZINE EDISYLATE 5 MG/ML IJ SOLN
10.0000 mg | Freq: Once | INTRAMUSCULAR | Status: AC
  Administered 2015-09-09: 10 mg via INTRAVENOUS
  Filled 2015-09-09: qty 2

## 2015-09-09 MED ORDER — HYDROCODONE-ACETAMINOPHEN 5-325 MG PO TABS: 1.0000 | ORAL_TABLET | ORAL | Status: DC | PRN

## 2015-09-09 MED ORDER — HYDROMORPHONE HCL 1 MG/ML IJ SOLN
1.0000 mg | Freq: Once | INTRAMUSCULAR | Status: AC
  Administered 2015-09-09: 1 mg via INTRAVENOUS
  Filled 2015-09-09: qty 1

## 2015-09-09 MED ORDER — PROMETHAZINE HCL 25 MG PO TABS: 25.0000 mg | ORAL_TABLET | Freq: Four times a day (QID) | ORAL | Status: DC | PRN
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
## 2015-09-09 NOTE — ED Notes (Signed)
Pt reports abd pain, v/d x3 days. Pt reports history of bowel obstruction 2016. Pt reports LBM this am. Pt reports seen for same at [facility] yesterday.

## 2015-09-11 ENCOUNTER — Emergency Department (HOSPITAL_COMMUNITY): Payer: Medicaid Other

## 2015-09-11 ENCOUNTER — Emergency Department (HOSPITAL_COMMUNITY)
Admission: EM | Admit: 2015-09-11 | Discharge: 2015-09-11 | Disposition: A | Discharge status: Home / Self Care | Payer: Medicaid Other | Attending: Emergency Medicine | Admitting: Emergency Medicine

## 2015-09-11 ENCOUNTER — Encounter (HOSPITAL_COMMUNITY): Payer: Self-pay | Admitting: *Deleted

## 2015-09-11 DIAGNOSIS — R1013 Epigastric pain: Secondary | ICD-10-CM | POA: Diagnosis present

## 2015-09-11 DIAGNOSIS — F17210 Nicotine dependence, cigarettes, uncomplicated: Secondary | ICD-10-CM | POA: Insufficient documentation

## 2015-09-11 DIAGNOSIS — R112 Nausea with vomiting, unspecified: Secondary | ICD-10-CM | POA: Insufficient documentation

## 2015-09-11 LAB — COMPREHENSIVE METABOLIC PANEL
ALT: 21 U/L (ref 17–63)
AST: 29 U/L (ref 15–41)
Albumin: 4.4 g/dL (ref 3.5–5.0)
Alkaline Phosphatase: 81 U/L (ref 38–126)
Anion gap: 11 (ref 5–15)
BILIRUBIN TOTAL: 1.1 mg/dL (ref 0.3–1.2)
BUN: 13 mg/dL (ref 6–20)
CALCIUM: 9 mg/dL (ref 8.9–10.3)
CO2: 26 mmol/L (ref 22–32)
CREATININE: 1.05 mg/dL (ref 0.61–1.24)
Chloride: 98 mmol/L — ABNORMAL LOW (ref 101–111)
Glucose, Bld: 104 mg/dL — ABNORMAL HIGH (ref 65–99)
Potassium: 3.3 mmol/L — ABNORMAL LOW (ref 3.5–5.1)
Sodium: 135 mmol/L (ref 135–145)
TOTAL PROTEIN: 8.3 g/dL — AB (ref 6.5–8.1)

## 2015-09-11 LAB — URINALYSIS, ROUTINE W REFLEX MICROSCOPIC
Bilirubin Urine: NEGATIVE
GLUCOSE, UA: NEGATIVE mg/dL
KETONES UR: 15 mg/dL — AB
LEUKOCYTES UA: NEGATIVE
Nitrite: NEGATIVE
PROTEIN: NEGATIVE mg/dL
Specific Gravity, Urine: 1.005 (ref 1.005–1.030)
pH: 7.5 (ref 5.0–8.0)

## 2015-09-11 LAB — URINE MICROSCOPIC-ADD ON

## 2015-09-11 LAB — CBC
HCT: 49.5 % (ref 39.0–52.0)
Hemoglobin: 17.5 g/dL — ABNORMAL HIGH (ref 13.0–17.0)
MCH: 30.1 pg (ref 26.0–34.0)
MCHC: 35.4 g/dL (ref 30.0–36.0)
MCV: 85.2 fL (ref 78.0–100.0)
PLATELETS: 248 10*3/uL (ref 150–400)
RBC: 5.81 MIL/uL (ref 4.22–5.81)
RDW: 13.9 % (ref 11.5–15.5)
WBC: 14.8 10*3/uL — AB (ref 4.0–10.5)

## 2015-09-11 LAB — I-STAT CG4 LACTIC ACID, ED: LACTIC ACID, VENOUS: 1.15 mmol/L (ref 0.5–2.0)

## 2015-09-11 LAB — LIPASE, BLOOD: LIPASE: 51 U/L (ref 11–51)

## 2015-09-11 MED ORDER — FAMOTIDINE 20 MG PO TABS: 20.0000 mg | ORAL_TABLET | Freq: Two times a day (BID) | ORAL | Status: DC

## 2015-09-11 MED ORDER — SODIUM CHLORIDE 0.9 % IV BOLUS (SEPSIS)
1000.0000 mL | Freq: Once | INTRAVENOUS | Status: AC
  Administered 2015-09-11: 1000 mL via INTRAVENOUS

## 2015-09-11 MED ORDER — IOHEXOL 300 MG/ML  SOLN
75.0000 mL | Freq: Once | INTRAMUSCULAR | Status: AC | PRN
  Administered 2015-09-11: 75 mL via INTRAVENOUS

## 2015-09-11 MED ORDER — HYDROCODONE-ACETAMINOPHEN 5-325 MG PO TABS: 2.0000 | ORAL_TABLET | ORAL | Status: DC | PRN

## 2015-09-11 MED ORDER — HYDROMORPHONE HCL 1 MG/ML IJ SOLN
1.0000 mg | Freq: Once | INTRAMUSCULAR | Status: AC
  Administered 2015-09-11: 1 mg via INTRAVENOUS
  Filled 2015-09-11: qty 1

## 2015-09-11 MED ORDER — ONDANSETRON HCL 4 MG/2ML IJ SOLN
4.0000 mg | Freq: Once | INTRAMUSCULAR | Status: AC
  Administered 2015-09-11: 4 mg via INTRAVENOUS
  Filled 2015-09-11: qty 2

## 2015-09-11 MED ORDER — FAMOTIDINE IN NACL 20-0.9 MG/50ML-% IV SOLN
20.0000 mg | Freq: Once | INTRAVENOUS | Status: AC
  Administered 2015-09-11: 20 mg via INTRAVENOUS
  Filled 2015-09-11: qty 50

## 2015-09-11 MED ORDER — ONDANSETRON HCL 4 MG PO TABS: 4.0000 mg | ORAL_TABLET | Freq: Four times a day (QID) | ORAL | Status: DC

## 2015-09-11 MED ORDER — GI COCKTAIL ~~LOC~~: 30.0000 mL | Freq: Once | ORAL | Status: DC

## 2015-09-11 MED ORDER — GI COCKTAIL ~~LOC~~
30.0000 mL | Freq: Once | ORAL | Status: AC
  Administered 2015-09-11: 30 mL via ORAL
  Filled 2015-09-11: qty 30

## 2015-09-11 MED ORDER — DIATRIZOATE MEGLUMINE & SODIUM 66-10 % PO SOLN
ORAL | Status: DC
Start: 2015-09-11 — End: 2015-09-12
  Filled 2015-09-11: qty 30

## 2015-09-11 NOTE — ED Notes (Signed)
Pt family came to nursing desk advising that pt was requesting additional pain medication,
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
## 2015-09-11 NOTE — ED Notes (Signed)
Pt ambulated to restroom with out assistance or difficulties

## 2015-09-11 NOTE — ED Notes (Signed)
Dr Hyacinth MeekerMiller and Tresa EndoKelly PA notified and at bedside,

## 2015-09-11 NOTE — ED Notes (Signed)
Pt is having epigastric pain and vomiting since Tuesday.  Pt was seen here for the same yesterday and discharge, told to come back if his symptoms got worse.

## 2015-09-11 NOTE — ED Notes (Signed)
Family at bedside, no needs identified at this time 

## 2015-09-11 NOTE — Discharge Instructions (Signed)
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
## 2015-09-17 MED FILL — Hydrocodone-Acetaminophen Tab 5-325 MG: ORAL | Qty: 6 | Status: AC

## 2015-09-27 ENCOUNTER — Ambulatory Visit (INDEPENDENT_AMBULATORY_CARE_PROVIDER_SITE_OTHER): Payer: Medicaid Other | Admitting: Internal Medicine

## 2015-09-27 ENCOUNTER — Encounter (INDEPENDENT_AMBULATORY_CARE_PROVIDER_SITE_OTHER): Payer: Self-pay | Admitting: Internal Medicine

## 2015-10-18 ENCOUNTER — Encounter (INDEPENDENT_AMBULATORY_CARE_PROVIDER_SITE_OTHER): Payer: Self-pay | Admitting: Internal Medicine

## 2015-12-15 ENCOUNTER — Emergency Department (HOSPITAL_COMMUNITY)
Admission: EM | Admit: 2015-12-15 | Discharge: 2015-12-15 | Disposition: A | Discharge status: Home / Self Care | Payer: Medicaid Other | Attending: Emergency Medicine | Admitting: Emergency Medicine

## 2015-12-15 ENCOUNTER — Encounter (HOSPITAL_COMMUNITY): Payer: Self-pay | Admitting: Emergency Medicine

## 2015-12-15 DIAGNOSIS — F17210 Nicotine dependence, cigarettes, uncomplicated: Secondary | ICD-10-CM | POA: Insufficient documentation

## 2015-12-15 DIAGNOSIS — H109 Unspecified conjunctivitis: Secondary | ICD-10-CM | POA: Diagnosis not present

## 2015-12-15 DIAGNOSIS — H5711 Ocular pain, right eye: Secondary | ICD-10-CM | POA: Diagnosis present

## 2015-12-15 MED ORDER — POLYMYXIN B-TRIMETHOPRIM 10000-0.1 UNIT/ML-% OP SOLN: 1.0000 [drp] | OPHTHALMIC | Status: DC

## 2015-12-15 MED ORDER — TETRACAINE HCL 0.5 % OP SOLN
2.0000 [drp] | Freq: Once | OPHTHALMIC | Status: AC
  Administered 2015-12-15: 2 [drp] via OPHTHALMIC
  Filled 2015-12-15: qty 2

## 2015-12-15 MED ORDER — FLUORESCEIN SODIUM 1 MG OP STRP
1.0000 | ORAL_STRIP | Freq: Once | OPHTHALMIC | Status: AC
  Administered 2015-12-15: 1 via OPHTHALMIC
  Filled 2015-12-15: qty 1

## 2015-12-15 NOTE — Discharge Instructions (Signed)

## 2015-12-15 NOTE — ED Notes (Signed)
Visual acuity L 20/30 ; Right 20/50
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
## 2015-12-15 NOTE — ED Notes (Signed)
PT reports that Friday he began having irritation in right eye. PT reports eye became red and now has yellow drainage. PT reports left eye became pink today. PT reports right eye feels as though it's throbbing when he puts his head down. PT reports his vision is not affected.

## 2015-12-18 NOTE — ED Notes (Signed)
Pt. Came to the front desk and needed a work note for his job.  Work note given
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
## 2020-03-01 ENCOUNTER — Other Ambulatory Visit: Payer: Self-pay

## 2020-03-01 ENCOUNTER — Other Ambulatory Visit: Payer: Self-pay | Admitting: Critical Care Medicine

## 2020-03-01 DIAGNOSIS — Z20822 Contact with and (suspected) exposure to covid-19: Secondary | ICD-10-CM

## 2020-03-03 LAB — NOVEL CORONAVIRUS, NAA: SARS-CoV-2, NAA: NOT DETECTED

## 2020-03-03 LAB — SPECIMEN STATUS REPORT

## 2020-03-03 LAB — SARS-COV-2, NAA 2 DAY TAT

## 2020-03-04 ENCOUNTER — Telehealth: Payer: Self-pay | Admitting: General Practice

## 2020-03-04 NOTE — Telephone Encounter (Signed)
Negative COVID results given. Patient results "NOT Detected." Caller expressed understanding. ° °

## 2020-09-28 DIAGNOSIS — R1909 Other intra-abdominal and pelvic swelling, mass and lump: Secondary | ICD-10-CM | POA: Insufficient documentation

## 2021-03-09 ENCOUNTER — Ambulatory Visit: Payer: Self-pay | Admitting: Gastroenterology

## 2021-03-09 ENCOUNTER — Encounter: Payer: Self-pay | Admitting: Gastroenterology

## 2021-04-18 ENCOUNTER — Other Ambulatory Visit: Payer: Self-pay

## 2021-04-18 ENCOUNTER — Emergency Department (HOSPITAL_COMMUNITY)
Admission: EM | Admit: 2021-04-18 | Discharge: 2021-04-19 | Disposition: A | Discharge status: Home / Self Care | Payer: BC Managed Care – PPO | Source: Home / Self Care | Attending: Emergency Medicine | Admitting: Emergency Medicine

## 2021-04-18 ENCOUNTER — Emergency Department (HOSPITAL_COMMUNITY)
Admission: EM | Admit: 2021-04-18 | Discharge: 2021-04-18 | Disposition: A | Discharge status: Home / Self Care | Payer: BC Managed Care – PPO | Attending: Emergency Medicine | Admitting: Emergency Medicine

## 2021-04-18 ENCOUNTER — Encounter (HOSPITAL_COMMUNITY): Payer: Self-pay | Admitting: *Deleted

## 2021-04-18 DIAGNOSIS — R000 Tachycardia, unspecified: Secondary | ICD-10-CM | POA: Insufficient documentation

## 2021-04-18 DIAGNOSIS — Z28310 Unvaccinated for covid-19: Secondary | ICD-10-CM | POA: Diagnosis not present

## 2021-04-18 DIAGNOSIS — R112 Nausea with vomiting, unspecified: Secondary | ICD-10-CM | POA: Insufficient documentation

## 2021-04-18 DIAGNOSIS — R509 Fever, unspecified: Secondary | ICD-10-CM | POA: Diagnosis present

## 2021-04-18 DIAGNOSIS — R1084 Generalized abdominal pain: Secondary | ICD-10-CM | POA: Insufficient documentation

## 2021-04-18 DIAGNOSIS — F17210 Nicotine dependence, cigarettes, uncomplicated: Secondary | ICD-10-CM | POA: Insufficient documentation

## 2021-04-18 DIAGNOSIS — Z20822 Contact with and (suspected) exposure to covid-19: Secondary | ICD-10-CM | POA: Diagnosis not present

## 2021-04-18 DIAGNOSIS — J101 Influenza due to other identified influenza virus with other respiratory manifestations: Secondary | ICD-10-CM | POA: Insufficient documentation

## 2021-04-18 DIAGNOSIS — Z79899 Other long term (current) drug therapy: Secondary | ICD-10-CM | POA: Insufficient documentation

## 2021-04-18 LAB — RESP PANEL BY RT-PCR (FLU A&B, COVID) ARPGX2
Influenza A by PCR: POSITIVE — AB
Influenza B by PCR: NEGATIVE
SARS Coronavirus 2 by RT PCR: NEGATIVE

## 2021-04-18 MED ORDER — ONDANSETRON HCL 4 MG/2ML IJ SOLN
4.0000 mg | Freq: Once | INTRAMUSCULAR | Status: AC
  Administered 2021-04-18: 4 mg via INTRAVENOUS
  Filled 2021-04-18: qty 2

## 2021-04-18 MED ORDER — ONDANSETRON 4 MG PO TBDP: 4.0000 mg | ORAL_TABLET | Freq: Three times a day (TID) | ORAL | 0 refills | Status: DC | PRN

## 2021-04-18 MED ORDER — SODIUM CHLORIDE 0.9 % IV BOLUS
1000.0000 mL | Freq: Once | INTRAVENOUS | Status: AC
  Administered 2021-04-18: 1000 mL via INTRAVENOUS

## 2021-04-18 MED ORDER — OSELTAMIVIR PHOSPHATE 75 MG PO CAPS: 75.0000 mg | ORAL_CAPSULE | Freq: Two times a day (BID) | ORAL | 0 refills | Status: DC

## 2021-04-18 MED ORDER — KETOROLAC TROMETHAMINE 60 MG/2ML IM SOLN
60.0000 mg | Freq: Once | INTRAMUSCULAR | Status: AC
  Administered 2021-04-18: 60 mg via INTRAMUSCULAR
  Filled 2021-04-18: qty 2

## 2021-04-18 MED ORDER — ACETAMINOPHEN 325 MG PO TABS
650.0000 mg | ORAL_TABLET | Freq: Once | ORAL | Status: AC | PRN
  Administered 2021-04-18: 650 mg via ORAL
  Filled 2021-04-18: qty 2

## 2021-04-18 NOTE — Discharge Instructions (Addendum)
Take Zofran as needed as prescribed for nausea and vomiting. Recheck with your primary care provider as needed.
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
## 2021-04-18 NOTE — Discharge Instructions (Signed)
Your flu test today was positive.  You have been prescribed medication to help you feel better sooner.  Please take the medication as directed until its finished.  Also drink plenty of fluids and alternate Tylenol and ibuprofen every 4 and 6 hours for fever and body aches.  Please wear a mask while around others.  Follow-up with your primary care provider for recheck.  Return emergency department for any new or worsening symptoms.

## 2021-04-18 NOTE — ED Notes (Signed)
Pt is difficult IV stick. Provider aware. 3rd nurse in to attempt.

## 2021-04-18 NOTE — ED Triage Notes (Addendum)
Pt c/o chills, cough, diarrhea and body aches since yesterday.
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
## 2021-04-18 NOTE — ED Triage Notes (Signed)
Pt c/o severe abdominal pain and states his jaw feels like it is locking up; pt states he was diagnosed with flu today and he has gotten worse

## 2021-04-19 ENCOUNTER — Emergency Department (HOSPITAL_COMMUNITY): Payer: BC Managed Care – PPO

## 2021-04-19 NOTE — ED Provider Notes (Signed)
Patient signed out to me to follow-up on progression.  Patient experiencing abdominal cramping with nausea and vomiting.  Patient was seen in the ED earlier today and tested positive for influenza A.  It is felt that this explains his current symptoms.  Multiple attempts by multiple different personnel were unsuccessful in getting blood, and kept hemolyzing.  I do not feel that the blood work will be helpful in this case.  I did perform an acute abdominal series because he has a history of bowel obstruction.  No obstruction seen.  Patient feeling better after IV fluids and Zofran.  Will discharge with continued symptomatic treatment.    Gilda Crease, MD 04/19/21 [id number]

## 2021-07-07 ENCOUNTER — Encounter: Payer: Self-pay | Admitting: Internal Medicine

## 2021-08-10 ENCOUNTER — Other Ambulatory Visit: Payer: Self-pay

## 2021-08-10 ENCOUNTER — Ambulatory Visit (INDEPENDENT_AMBULATORY_CARE_PROVIDER_SITE_OTHER): Payer: BLUE CROSS/BLUE SHIELD | Admitting: Internal Medicine

## 2021-08-10 ENCOUNTER — Telehealth: Payer: Self-pay

## 2021-08-10 VITALS — BP 110/70 | HR 72 | Temp 97.7°F | Ht 70.0 in | Wt 162.8 lb

## 2021-08-10 DIAGNOSIS — R1032 Left lower quadrant pain: Secondary | ICD-10-CM

## 2021-08-10 DIAGNOSIS — R112 Nausea with vomiting, unspecified: Secondary | ICD-10-CM

## 2021-08-10 DIAGNOSIS — R197 Diarrhea, unspecified: Secondary | ICD-10-CM

## 2021-08-10 MED ORDER — PEG 3350-KCL-NA BICARB-NACL 420 G PO SOLR: 4000.0000 mL | ORAL | 0 refills | Status: DC

## 2021-08-10 MED ORDER — ONDANSETRON HCL 4 MG PO TABS: 4.0000 mg | ORAL_TABLET | Freq: Three times a day (TID) | ORAL | 1 refills | Status: DC | PRN

## 2021-08-10 NOTE — Patient Instructions (Signed)
We will schedule you for upper endoscopy and colonoscopy to further evaluate your  Nausea vomiting, abdominal pain, diarrhea. ? ?I will send in a medication called ondansetron to take every 8 hours as needed for nausea. ? ?Marijuana use may be contributing to your chronic nausea and vomiting.  Recommend you try to reduce this as best as you can. ? ?Further recommendations to follow. ? ?It was nice meeting you today. ? ?Dr. Abbey Chatters ? ?At [facility] we value your feedback. You may receive a survey about your visit today. Please share your experience as we strive to create trusting relationships with our patients to provide genuine, compassionate, quality care. ? ?We appreciate your understanding and patience as we review any laboratory studies, imaging, and other diagnostic tests that are ordered as we care for you. Our office policy is 5 business days for review of these results, and any emergent or urgent results are addressed in a timely manner for your best interest. If you do not hear from our office in 1 week, please contact us.  ? ?We also encourage the use of MyChart, which contains your medical information for your review as well. If you are not enrolled in this feature, an access code is on this after visit summary for your convenience. Thank you for allowing [place] to be involved in your care. ? ?It was great to see you today!  I hope you have a great rest of your Winter! ? ? ? ?Rodney Carter. Abbey Chatters, D.O. ?Gastroenterology and Hepatology ?[facility] ? ?
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
## 2021-08-10 NOTE — Telephone Encounter (Signed)
TCS/EGD scheduled at [place] for 09/06/21 at 10:30am. Checked AIM website to see if PA needed. Per AIM website, coverage ended 08/09/21. ?
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
## 2021-08-15 ENCOUNTER — Telehealth: Payer: Self-pay | Admitting: Internal Medicine

## 2021-08-15 NOTE — Telephone Encounter (Signed)
Patient called asking if he could be placed on a cancellation list for his procedure to be done sooner ?

## 2021-08-15 NOTE — Telephone Encounter (Signed)
Called pt. He wants procedure done sooner than scheduled. He is now on for 3/9 at 1:00pm. Aware will send updated instructions to mychart. ?

## 2021-08-16 ENCOUNTER — Encounter (HOSPITAL_COMMUNITY)
Admission: RE | Admit: 2021-08-16 | Discharge: 2021-08-16 | Disposition: A | Discharge status: Home / Self Care | Payer: Self-pay | Source: Ambulatory Visit | Attending: Internal Medicine | Admitting: Internal Medicine

## 2021-08-16 ENCOUNTER — Encounter (HOSPITAL_COMMUNITY): Payer: Self-pay

## 2021-08-16 ENCOUNTER — Other Ambulatory Visit: Payer: Self-pay

## 2021-08-16 HISTORY — DX: Gastro-esophageal reflux disease without esophagitis: K21.9

## 2021-08-18 ENCOUNTER — Encounter (HOSPITAL_COMMUNITY): Payer: Self-pay

## 2021-08-18 ENCOUNTER — Ambulatory Visit (HOSPITAL_BASED_OUTPATIENT_CLINIC_OR_DEPARTMENT_OTHER): Payer: Self-pay | Admitting: Certified Registered"

## 2021-08-18 ENCOUNTER — Encounter (HOSPITAL_COMMUNITY)
Admission: RE | Disposition: A | Discharge status: Home / Self Care | Payer: Self-pay | Source: Home / Self Care | Attending: Internal Medicine

## 2021-08-18 ENCOUNTER — Ambulatory Visit (HOSPITAL_COMMUNITY): Payer: Self-pay | Admitting: Certified Registered"

## 2021-08-18 ENCOUNTER — Ambulatory Visit (HOSPITAL_COMMUNITY)
Admission: RE | Admit: 2021-08-18 | Discharge: 2021-08-18 | Disposition: A | Discharge status: Home / Self Care | Payer: Self-pay | Attending: Internal Medicine | Admitting: Internal Medicine

## 2021-08-18 DIAGNOSIS — K2990 Gastroduodenitis, unspecified, without bleeding: Secondary | ICD-10-CM

## 2021-08-18 DIAGNOSIS — K2950 Unspecified chronic gastritis without bleeding: Secondary | ICD-10-CM | POA: Insufficient documentation

## 2021-08-18 DIAGNOSIS — K529 Noninfective gastroenteritis and colitis, unspecified: Secondary | ICD-10-CM

## 2021-08-18 DIAGNOSIS — K648 Other hemorrhoids: Secondary | ICD-10-CM | POA: Insufficient documentation

## 2021-08-18 DIAGNOSIS — K2970 Gastritis, unspecified, without bleeding: Secondary | ICD-10-CM

## 2021-08-18 DIAGNOSIS — K2980 Duodenitis without bleeding: Secondary | ICD-10-CM | POA: Insufficient documentation

## 2021-08-18 DIAGNOSIS — F17210 Nicotine dependence, cigarettes, uncomplicated: Secondary | ICD-10-CM | POA: Insufficient documentation

## 2021-08-18 DIAGNOSIS — F1290 Cannabis use, unspecified, uncomplicated: Secondary | ICD-10-CM | POA: Insufficient documentation

## 2021-08-18 HISTORY — PX: COLONOSCOPY WITH PROPOFOL: SHX5780

## 2021-08-18 HISTORY — PX: BIOPSY: SHX5522

## 2021-08-18 HISTORY — PX: ESOPHAGOGASTRODUODENOSCOPY (EGD) WITH PROPOFOL: SHX5813

## 2021-08-18 SURGERY — COLONOSCOPY WITH PROPOFOL
Anesthesia: General

## 2021-08-18 MED ORDER — LACTATED RINGERS IV SOLN: INTRAVENOUS | Status: DC

## 2021-08-18 MED ORDER — DEXMEDETOMIDINE (PRECEDEX) IN NS 20 MCG/5ML (4 MCG/ML) IV SYRINGE
PREFILLED_SYRINGE | INTRAVENOUS | Status: DC | PRN
  Administered 2021-08-18: 20 ug via INTRAVENOUS

## 2021-08-18 MED ORDER — DEXMEDETOMIDINE (PRECEDEX) IN NS 20 MCG/5ML (4 MCG/ML) IV SYRINGE
PREFILLED_SYRINGE | INTRAVENOUS | Status: AC
  Filled 2021-08-18: qty 5

## 2021-08-18 MED ORDER — PROPOFOL 10 MG/ML IV BOLUS
INTRAVENOUS | Status: DC | PRN
  Administered 2021-08-18 (×2): 100 mg via INTRAVENOUS

## 2021-08-18 MED ORDER — LIDOCAINE HCL (CARDIAC) PF 100 MG/5ML IV SOSY
PREFILLED_SYRINGE | INTRAVENOUS | Status: DC | PRN
  Administered 2021-08-18: 50 mg via INTRAVENOUS

## 2021-08-18 MED ORDER — PROPOFOL 500 MG/50ML IV EMUL
INTRAVENOUS | Status: DC | PRN
  Administered 2021-08-18: 150 ug/kg/min via INTRAVENOUS

## 2021-08-18 MED ORDER — PANTOPRAZOLE SODIUM 40 MG PO TBEC: 40.0000 mg | DELAYED_RELEASE_TABLET | Freq: Two times a day (BID) | ORAL | 5 refills | Status: DC

## 2021-08-18 NOTE — Op Note (Signed)
[facility] ?Patient Name: Rodney Carter ?Procedure Date: 08/18/2021 11:31 AM ?MRN: [id number] ?Date of Birth: 1980/10/09 ?Attending MD: Elon Alas. Abbey Chatters , DO ?CSN: [id number] ?Age: 41 ?Admit Type: Outpatient ?Procedure:                Upper GI endoscopy ?Indications:              Epigastric abdominal pain, Nausea with vomiting ?Providers:                Elon Alas. Abbey Chatters, DO, Hughie Closs RN, RN, Tammy  ?                          Vaught, RN, Minette Headland L. Risa Grill, Technician ?Referring MD:              ?Medicines:                See the Anesthesia note for documentation of the  ?                          administered medications ?Complications:            No immediate complications. ?Estimated Blood Loss:     Estimated blood loss was minimal. ?Procedure:                Pre-Anesthesia Assessment: ?                          - The anesthesia plan was to use monitored  ?                          anesthesia care (MAC). ?                          After obtaining informed consent, the endoscope was  ?                          passed under direct vision. Throughout the  ?                          procedure, the patient's blood pressure, pulse, and  ?                          oxygen saturations were monitored continuously. The  ?                          GIF-H190 ([id number]) scope was introduced through the  ?                          mouth, and advanced to the second part of duodenum.  ?                          The upper GI endoscopy was accomplished without  ?                          difficulty. The patient tolerated the procedure  ?  well. ?Scope In: 11:59:44 AM ?Scope Out: 12:03:37 PM ?Total Procedure Duration: 0 hours 3 minutes 53 seconds  ?Findings: ?     The Z-line was regular and was found 39 cm from the incisors. ?     Patchy mild inflammation characterized by erythema was found in the  ?     entire examined stomach. Biopsies were taken with a cold forceps for  ?     Helicobacter pylori  testing. ?     Localized moderate inflammation characterized by erosions, erythema and  ?     shallow ulcerations was found in the duodenal bulb and in the first  ?     portion of the duodenum. Biopsies were taken with a cold forceps for  ?     histology. ?Impression:               - Z-line regular, 39 cm from the incisors. ?                          - Gastritis. Biopsied. ?                          - Duodenitis. Biopsied. ?Moderate Sedation: ?     Per Anesthesia Care ?Recommendation:           - Patient has a contact number available for  ?                          emergencies. The signs and symptoms of potential  ?                          delayed complications were discussed with the  ?                          patient. Return to normal activities tomorrow.  ?                          Written discharge instructions were provided to the  ?                          patient. ?                          - Resume previous diet. ?                          - Continue present medications. ?                          - Await pathology results. ?                          - Use Protonix (pantoprazole) 40 mg PO BID. ?                          - Return to GI clinic in 4 months. ?Procedure Code(s):        --- Professional --- ?  40102, Esophagogastroduodenoscopy, flexible,  ?                          transoral; with biopsy, single or multiple ?Diagnosis Code(s):        --- Professional --- ?                          K29.70, Gastritis, unspecified, without bleeding ?                          K29.80, Duodenitis without bleeding ?                          R10.13, Epigastric pain ?                          R11.2, Nausea with vomiting, unspecified ?CPT copyright 2019 American Medical Association. All rights reserved. ?The codes documented in this report are preliminary and upon coder review may  ?be revised to meet current compliance requirements. ?Elon Alas. Abbey Chatters, DO ?Elon Alas. Abbey Chatters, DO ?08/18/2021  12:06:16 PM ?This report has been signed electronically. ?Number of Addenda: 0 ?

## 2021-08-18 NOTE — Transfer of Care (Signed)
Immediate Anesthesia Transfer of Care Note ? ?Patient: Rodney Carter ? ?Procedure(s) Performed: COLONOSCOPY WITH PROPOFOL ?ESOPHAGOGASTRODUODENOSCOPY (EGD) WITH PROPOFOL ?BIOPSY ? ?Patient Location: Short Stay ? ?Anesthesia Type:General ? ?Level of Consciousness: drowsy ? ?Airway & Oxygen Therapy: Patient Spontanous Breathing ? ?Post-op Assessment: Report given to RN and Post -op Vital signs reviewed and stable ? ?Post vital signs: Reviewed and stable ? ?Last Vitals:  ?Vitals Value Taken Time  ?BP 113/80 08/18/21 1225  ?Temp 36.7 ?C 08/18/21 1225  ?Pulse 52 08/18/21 1225  ?Resp 13 08/18/21 1225  ?SpO2 100 % 08/18/21 1225  ? ? ?Last Pain:  ?Vitals:  ? 08/18/21 1225  ?TempSrc: Oral  ?PainSc: 0-No pain  ?   ? ?  ? ?Complications: No notable events documented. ?

## 2021-08-18 NOTE — Op Note (Signed)
[facility] ?Patient Name: Rodney Carter ?Procedure Date: 08/18/2021 12:06 PM ?MRN: [id number] ?Date of Birth: [birth year] ?Attending MD: Elon Alas. Abbey Chatters , DO ?CSN: [id number] ?Age: 41 ?Admit Type: Outpatient ?Procedure:                Colonoscopy ?Indications:              Chronic diarrhea ?Providers:                Elon Alas. Abbey Chatters, DO, Hughie Closs RN, RN, Tammy  ?                          Vaught, RN, [employer], Technician ?Referring MD:              ?Medicines:                See the Anesthesia note for documentation of the  ?                          administered medications ?Complications:            No immediate complications. ?Estimated Blood Loss:     Estimated blood loss was minimal. ?Procedure:                Pre-Anesthesia Assessment: ?                          - The anesthesia plan was to use monitored  ?                          anesthesia care (MAC). ?                          After obtaining informed consent, the colonoscope  ?                          was passed under direct vision. Throughout the  ?                          procedure, the patient's blood pressure, pulse, and  ?                          oxygen saturations were monitored continuously. The  ?                          PCF-HQ190L ([id number]) scope was introduced through  ?                          the anus and advanced to the the cecum, identified  ?                          by appendiceal orifice and ileocecal valve. The  ?                          colonoscopy was performed without difficulty. The  ?                          patient tolerated the procedure  well. The quality  ?                          of the bowel preparation was evaluated using the  ?                          BBPS [facility] Bowel Preparation Scale) with scores  ?                          of: Right Colon = 3, Transverse Colon = 3 and Left  ?                          Colon = 3 (entire mucosa seen well with no residual  ?                          staining,  small fragments of stool or opaque  ?                          liquid). The total BBPS score equals 9. ?Scope In: 12:07:13 PM ?Scope Out: 12:20:47 PM ?Scope Withdrawal Time: 0 hours 7 minutes 36 seconds  ?Total Procedure Duration: 0 hours 13 minutes 34 seconds  ?Findings: ?     The perianal and digital rectal examinations were normal. ?     Internal hemorrhoids were found during endoscopy. ?     The entire examined colon appeared normal. ?     Biopsies for histology were taken with a cold forceps from the ascending  ?     colon, transverse colon and descending colon for evaluation of  ?     microscopic colitis. ?Impression:               - Internal hemorrhoids. ?                          - The entire examined colon is normal. ?                          - Biopsies were taken with a cold forceps from the  ?                          ascending colon, transverse colon and descending  ?                          colon for evaluation of microscopic colitis. ?Moderate Sedation: ?     Per Anesthesia Care ?Recommendation:           - Patient has a contact number available for  ?                          emergencies. The signs and symptoms of potential  ?                          delayed complications were discussed with the  ?                          patient. Return to normal  activities tomorrow.  ?                          Written discharge instructions were provided to the  ?                          patient. ?                          - Resume previous diet. ?                          - Continue present medications. ?                          - Await pathology results. ?                          - Repeat colonoscopy in 10 years for screening  ?                          purposes. ?                          - Return to GI clinic in 4 months. ?Procedure Code(s):        --- Professional --- ?                          [phone], Colonoscopy, flexible; with biopsy, single  ?                          or multiple ?Diagnosis Code(s):         --- Professional --- ?                          O75.6, Other hemorrhoids ?                          K52.9, Noninfective gastroenteritis and colitis,  ?                          unspecified ?CPT copyright 2019 American Medical Association. All rights reserved. ?The codes documented in this report are preliminary and upon coder review may  ?be revised to meet current compliance requirements. ?Elon Alas. Abbey Chatters, DO ?Elon Alas. [employer], DO ?08/18/2021 12:22:31 PM ?This report has been signed electronically. ?Number of Addenda: 0 ?

## 2021-08-18 NOTE — Anesthesia Postprocedure Evaluation (Signed)
Anesthesia Post Note ? ?Patient: Rodney Carter ? ?Procedure(s) Performed: COLONOSCOPY WITH PROPOFOL ?ESOPHAGOGASTRODUODENOSCOPY (EGD) WITH PROPOFOL ?BIOPSY ? ?Patient location during evaluation: Phase II ?Anesthesia Type: General ?Level of consciousness: awake ?Pain management: pain level controlled ?Vital Signs Assessment: post-procedure vital signs reviewed and stable ?Respiratory status: spontaneous breathing and respiratory function stable ?Cardiovascular status: blood pressure returned to baseline and stable ?Postop Assessment: no headache and no apparent nausea or vomiting ?Anesthetic complications: no ?Comments: Late entry ? ? ?No notable events documented. ? ? ?Last Vitals:  ?Vitals:  ? 08/18/21 1145 08/18/21 1225  ?BP:  113/80  ?Pulse: (!) 51 (!) 52  ?Resp: 16 13  ?Temp: 36.8 ?C 36.7 ?C  ?SpO2: 100% 100%  ?  ?Last Pain:  ?Vitals:  ? 08/18/21 1225  ?TempSrc: Oral  ?PainSc: 0-No pain  ? ? ?  ?  ?  ?  ?  ?  ? ?Louann Sjogren ? ? ? ? ?

## 2021-08-18 NOTE — Interval H&P Note (Signed)
History and Physical Interval Note: ? ?08/18/2021 ?11:35 AM ? ?Rodney Carter  has presented today for surgery, with the diagnosis of abdominal pain, diarrhea, nausea/vomiting.  The various methods of treatment have been discussed with the patient and family. After consideration of risks, benefits and other options for treatment, the patient has consented to  Procedure(s) with comments: ?COLONOSCOPY WITH PROPOFOL (N/A) - 10:30am ?ESOPHAGOGASTRODUODENOSCOPY (EGD) WITH PROPOFOL (N/A) as a surgical intervention.  The patient's history has been reviewed, patient examined, no change in status, stable for surgery.  I have reviewed the patient's chart and labs.  Questions were answered to the patient's satisfaction.   ? ? ?Lanelle Bal ? ? ?

## 2021-08-18 NOTE — Discharge Instructions (Addendum)
EGD ?Discharge instructions ?Please read the instructions outlined below and refer to this sheet in the next few weeks. These discharge instructions provide you with general information on caring for yourself after you leave the hospital. Your doctor may also give you specific instructions. While your treatment has been planned according to the most current medical practices available, unavoidable complications occasionally occur. If you have any problems or questions after discharge, please call your doctor. ?ACTIVITY ?You may resume your regular activity but move at a slower pace for the next 24 hours.  ?Take frequent rest periods for the next 24 hours.  ?Walking will help expel (get rid of) the air and reduce the bloated feeling in your abdomen.  ?No driving for 24 hours (because of the anesthesia (medicine) used during the test).  ?You may shower.  ?Do not sign any important legal documents or operate any machinery for 24 hours (because of the anesthesia used during the test).  ?NUTRITION ?Drink plenty of fluids.  ?You may resume your normal diet.  ?Begin with a light meal and progress to your normal diet.  ?Avoid alcoholic beverages for 24 hours or as instructed by your caregiver.  ?MEDICATIONS ?You may resume your normal medications unless your caregiver tells you otherwise.  ?WHAT YOU CAN EXPECT TODAY ?You may experience abdominal discomfort such as a feeling of fullness or ?gas? pains.  ?FOLLOW-UP ?Your doctor will discuss the results of your test with you.  ?SEEK IMMEDIATE MEDICAL ATTENTION IF ANY OF THE FOLLOWING OCCUR: ?Excessive nausea (feeling sick to your stomach) and/or vomiting.  ?Severe abdominal pain and distention (swelling).  ?Trouble swallowing.  ?Temperature over 101? F (37.8? C).  ?Rectal bleeding or vomiting of blood.  ? ?Your EGD revealed mild amount inflammation in your stomach.  I took biopsies of this to rule out infection with a bacteria called H. pylori.  Await pathology results, my  office will contact you.  Your small bowel was also significantly inflamed.  You had 3 small ulcers in your small bowel.  Took biopsies of this area as well.  I am going to start you on pantoprazole 40 mg twice daily.  Avoid NSAIDs as best you can. ? ?Your colonoscopy was relatively unremarkable.  I did not find any polyps or evidence of colon cancer.  I recommend repeating colonoscopy in 10 years for colon cancer screening purposes.  I did not see any active inflammation indicative of underlying inflammatory bowel disease such as Crohn's disease or ulcerative colitis.  I did take biopsies of your colon as well. ? ?Otherwise follow-up with GI in 3 to 4 months. ? ? ? ?I hope you have a great rest of your week! ? ?Elon Alas. Abbey Chatters, D.O. ?Gastroenterology and Hepatology ?[facility] ? ?

## 2021-08-18 NOTE — Anesthesia Preprocedure Evaluation (Signed)
Anesthesia Evaluation  °Patient identified by MRN, date of birth, ID band °Patient awake ° ° ° °Reviewed: °Allergy & Precautions, H&P , NPO status , Patient's Chart, lab work & pertinent test results, reviewed documented beta blocker date and time  ° °Airway °Mallampati: II ° °TM Distance: >3 FB °Neck ROM: full ° ° ° Dental °no notable dental hx. ° °  °Pulmonary °neg pulmonary ROS, Current Smoker,  °  °Pulmonary exam normal °breath sounds clear to auscultation ° ° ° ° ° ° Cardiovascular °Exercise Tolerance: Good °negative cardio ROS ° ° °Rhythm:regular Rate:Normal ° ° °  °Neuro/Psych °negative neurological ROS ° negative psych ROS  ° GI/Hepatic °Neg liver ROS, GERD  Medicated,  °Endo/Other  °negative endocrine ROS ° Renal/GU °negative Renal ROS  °negative genitourinary °  °Musculoskeletal ° ° Abdominal °  °Peds ° Hematology °negative hematology ROS °(+)   °Anesthesia Other Findings ° ° Reproductive/Obstetrics °negative OB ROS ° °  ° ° ° ° ° ° ° ° ° ° ° ° ° °  °  ° ° ° ° ° ° ° ° °Anesthesia Physical °Anesthesia Plan ° °ASA: 2 ° °Anesthesia Plan: General  ° °Post-op Pain Management:   ° °Induction:  ° °PONV Risk Score and Plan: Propofol infusion ° °Airway Management Planned:  ° °Additional Equipment:  ° °Intra-op Plan:  ° °Post-operative Plan:  ° °Informed Consent: I have reviewed the patients History and Physical, chart, labs and discussed the procedure including the risks, benefits and alternatives for the proposed anesthesia with the patient or authorized representative who has indicated his/her understanding and acceptance.  ° ° ° °Dental Advisory Given ° °Plan Discussed with: CRNA ° °Anesthesia Plan Comments:   ° ° ° ° ° ° °Anesthesia Quick Evaluation ° °

## 2021-08-22 LAB — SURGICAL PATHOLOGY

## 2021-08-23 ENCOUNTER — Encounter (HOSPITAL_COMMUNITY): Payer: Self-pay | Admitting: Internal Medicine

## 2021-08-23 ENCOUNTER — Other Ambulatory Visit: Payer: Self-pay

## 2021-08-23 DIAGNOSIS — B9681 Helicobacter pylori [H. pylori] as the cause of diseases classified elsewhere: Secondary | ICD-10-CM

## 2021-08-23 MED ORDER — AMOXICILL-CLARITHRO-LANSOPRAZ PO MISC: Freq: Two times a day (BID) | ORAL | 0 refills | Status: DC

## 2021-08-25 ENCOUNTER — Telehealth: Payer: Self-pay | Admitting: Internal Medicine

## 2021-08-25 ENCOUNTER — Other Ambulatory Visit: Payer: Self-pay

## 2021-08-25 MED ORDER — PYLERA 140-125-125 MG PO CAPS: 3.0000 | ORAL_CAPSULE | Freq: Three times a day (TID) | ORAL | 0 refills | Status: DC

## 2021-08-25 NOTE — Telephone Encounter (Signed)
Patient called and said his pharmacy told him they no longer make what we prescribed for him  ?

## 2021-08-25 NOTE — Telephone Encounter (Signed)
Phoned and advised the pt that we sent in Pylera instead of a Prevpac, ?. ? ?Sent in Rx to [facility] for Pylera  ?

## 2021-09-06 NOTE — Telephone Encounter (Signed)
Pt called and LMOVM stating that the pharmacy has discontinued his medication for his H Pylori gastritis. He doesn't know if you need to send in a new script but he did say "that he needed something else sent in". Please advise. ?

## 2021-09-08 ENCOUNTER — Telehealth: Payer: Self-pay | Admitting: Internal Medicine

## 2021-09-08 ENCOUNTER — Telehealth: Payer: Self-pay

## 2021-09-08 DIAGNOSIS — A048 Other specified bacterial intestinal infections: Secondary | ICD-10-CM

## 2021-09-08 MED ORDER — AMOXICILLIN 500 MG PO CAPS: 1000.0000 mg | ORAL_CAPSULE | Freq: Two times a day (BID) | ORAL | 0 refills | Status: AC

## 2021-09-08 MED ORDER — CLARITHROMYCIN 500 MG PO TABS: 500.0000 mg | ORAL_TABLET | Freq: Two times a day (BID) | ORAL | 0 refills | Status: AC

## 2021-09-08 NOTE — Telephone Encounter (Signed)
Dr Marletta Lor ?Pt didn't go get his  Rx when we phoned it in and now he is needing for [place] to resend it. Please advise ?

## 2021-09-08 NOTE — Telephone Encounter (Signed)
I will send in individual prescriptions for patient.  He needs to take amoxicillin 1000 mg twice daily x14 days.  He needs to take clarithromycin 500 mg twice daily x14 days.  Continue on pantoprazole 40 mg twice daily. Follow-up as previously scheduled.  Thank you ?

## 2021-09-08 NOTE — Telephone Encounter (Signed)
Returned the pt's call left on my Vm and there was no answer, phone continuously rang ?

## 2021-09-08 NOTE — Telephone Encounter (Signed)
Phoned the pt and the wireless customer cannot be reached ?

## 2021-09-09 NOTE — Telephone Encounter (Signed)
Phoned the pt and advised him of the Rx's sent in and directions along with using it with his Pantoprazole. Pt expressed understanding of these instructions. Pt was made aware of how many Rx he would be picking up. ?

## 2021-11-01 ENCOUNTER — Telehealth: Payer: Self-pay

## 2021-11-01 DIAGNOSIS — K859 Acute pancreatitis without necrosis or infection, unspecified: Secondary | ICD-10-CM | POA: Insufficient documentation

## 2021-11-01 DIAGNOSIS — K5790 Diverticulosis of intestine, part unspecified, without perforation or abscess without bleeding: Secondary | ICD-10-CM | POA: Insufficient documentation

## 2021-11-01 DIAGNOSIS — Z8719 Personal history of other diseases of the digestive system: Secondary | ICD-10-CM | POA: Insufficient documentation

## 2021-11-01 NOTE — Telephone Encounter (Signed)
Dr. Marletta Lor, The pt LMOVM from his hospital bed wanting to speak with you. His number is [phone].

## 2021-11-01 NOTE — Telephone Encounter (Signed)
FYI:  Pt phoned yesterday and LMOVM, but I see where he was admitted at [facility] in [place]

## 2021-11-09 ENCOUNTER — Encounter: Payer: Self-pay | Admitting: Internal Medicine

## 2021-11-11 ENCOUNTER — Encounter: Payer: Self-pay | Admitting: Orthopedic Surgery

## 2021-11-30 ENCOUNTER — Telehealth: Payer: Self-pay

## 2021-11-30 ENCOUNTER — Encounter: Payer: Self-pay | Admitting: Orthopaedic Surgery

## 2021-11-30 ENCOUNTER — Ambulatory Visit (INDEPENDENT_AMBULATORY_CARE_PROVIDER_SITE_OTHER): Payer: Self-pay | Admitting: Orthopaedic Surgery

## 2021-11-30 VITALS — Ht 70.0 in | Wt 170.0 lb

## 2021-11-30 DIAGNOSIS — M542 Cervicalgia: Secondary | ICD-10-CM

## 2021-11-30 DIAGNOSIS — M545 Low back pain, unspecified: Secondary | ICD-10-CM

## 2021-11-30 DIAGNOSIS — M79604 Pain in right leg: Secondary | ICD-10-CM

## 2021-11-30 MED ORDER — HYDROCODONE-ACETAMINOPHEN 5-325 MG PO TABS: ORAL_TABLET | ORAL | 0 refills | Status: DC

## 2021-11-30 MED ORDER — NAPROXEN 500 MG PO TABS: 500.0000 mg | ORAL_TABLET | Freq: Two times a day (BID) | ORAL | 5 refills | Status: DC

## 2021-11-30 MED ORDER — CYCLOBENZAPRINE HCL 10 MG PO TABS: 10.0000 mg | ORAL_TABLET | Freq: Every day | ORAL | 0 refills | Status: DC

## 2021-11-30 NOTE — Telephone Encounter (Signed)
Pt LMOVM needing to rschedule his appt. Please call

## 2021-11-30 NOTE — Telephone Encounter (Signed)
noted 
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
## 2021-12-07 ENCOUNTER — Encounter: Payer: Self-pay | Admitting: Internal Medicine

## 2021-12-07 ENCOUNTER — Encounter: Payer: Self-pay | Admitting: *Deleted

## 2021-12-07 ENCOUNTER — Ambulatory Visit (INDEPENDENT_AMBULATORY_CARE_PROVIDER_SITE_OTHER): Payer: Self-pay | Admitting: Internal Medicine

## 2021-12-07 VITALS — BP 127/76 | HR 83 | Temp 98.1°F | Ht 70.0 in | Wt 169.0 lb

## 2021-12-07 DIAGNOSIS — Z8719 Personal history of other diseases of the digestive system: Secondary | ICD-10-CM | POA: Insufficient documentation

## 2021-12-07 DIAGNOSIS — G8929 Other chronic pain: Secondary | ICD-10-CM | POA: Insufficient documentation

## 2021-12-07 DIAGNOSIS — B9681 Helicobacter pylori [H. pylori] as the cause of diseases classified elsewhere: Secondary | ICD-10-CM | POA: Insufficient documentation

## 2021-12-07 DIAGNOSIS — R1013 Epigastric pain: Secondary | ICD-10-CM

## 2021-12-07 DIAGNOSIS — K2970 Gastritis, unspecified, without bleeding: Secondary | ICD-10-CM

## 2021-12-07 DIAGNOSIS — Z13220 Encounter for screening for lipoid disorders: Secondary | ICD-10-CM

## 2021-12-07 NOTE — Patient Instructions (Signed)
We need to check to make sure that your H. pylori has been eradicated successfully with antibiotics.  I am going to order a breath test to be performed at [facility].  Do not take pantoprazole or Pepto-Bismol for at least 10 days prior to the test.  I am also going to check your triglycerides level (blood test) as a potential cause of your pancreatitis which will be at [facility] as well.  I am going to order right upper quadrant ultrasound to evaluate for gallstones.  We will call with these results.  Otherwise follow-up in 2 to 3 months.  It was good seeing you again today.  Dr. Marletta Lor
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
## 2021-12-14 ENCOUNTER — Ambulatory Visit (HOSPITAL_COMMUNITY): Payer: Medicaid Other

## 2021-12-19 ENCOUNTER — Telehealth: Payer: Self-pay | Admitting: Orthopaedic Surgery

## 2021-12-20 ENCOUNTER — Ambulatory Visit (HOSPITAL_COMMUNITY)
Admission: RE | Admit: 2021-12-20 | Discharge: 2021-12-20 | Disposition: A | Discharge status: Home / Self Care | Payer: Medicaid Other | Source: Ambulatory Visit | Attending: Orthopaedic Surgery | Admitting: Orthopaedic Surgery

## 2021-12-20 DIAGNOSIS — M545 Low back pain, unspecified: Secondary | ICD-10-CM | POA: Insufficient documentation

## 2021-12-20 DIAGNOSIS — M79604 Pain in right leg: Secondary | ICD-10-CM | POA: Insufficient documentation

## 2021-12-20 MED ORDER — HYDROCODONE-ACETAMINOPHEN 5-325 MG PO TABS: ORAL_TABLET | ORAL | 0 refills | Status: DC

## 2021-12-22 ENCOUNTER — Ambulatory Visit (HOSPITAL_COMMUNITY): Payer: Medicaid Other

## 2021-12-23 ENCOUNTER — Ambulatory Visit (HOSPITAL_COMMUNITY)
Admission: RE | Admit: 2021-12-23 | Discharge: 2021-12-23 | Disposition: A | Discharge status: Home / Self Care | Payer: Medicaid Other | Source: Ambulatory Visit | Attending: Internal Medicine | Admitting: Internal Medicine

## 2021-12-23 DIAGNOSIS — R1013 Epigastric pain: Secondary | ICD-10-CM | POA: Insufficient documentation

## 2021-12-23 DIAGNOSIS — G8929 Other chronic pain: Secondary | ICD-10-CM

## 2021-12-27 ENCOUNTER — Encounter: Payer: Self-pay | Admitting: Internal Medicine

## 2021-12-28 ENCOUNTER — Ambulatory Visit (INDEPENDENT_AMBULATORY_CARE_PROVIDER_SITE_OTHER): Payer: Self-pay | Admitting: Orthopaedic Surgery

## 2021-12-28 ENCOUNTER — Encounter: Payer: Self-pay | Admitting: Orthopaedic Surgery

## 2021-12-28 DIAGNOSIS — M545 Low back pain, unspecified: Secondary | ICD-10-CM

## 2021-12-28 DIAGNOSIS — M79604 Pain in right leg: Secondary | ICD-10-CM

## 2021-12-28 MED ORDER — HYDROCODONE-ACETAMINOPHEN 5-325 MG PO TABS: ORAL_TABLET | ORAL | 0 refills | Status: DC

## 2021-12-28 NOTE — Progress Notes (Signed)
My back is worse.  He continues to have lower back pain with radiation down on the right side.  He had MRI which showed: IMPRESSION: 1. Lumbar spondylosis and degenerative disc disease, causing moderate right eccentric impingement at L5-S1, and mild left eccentric impingement at L4-5. A dominant contributing factor in the right-sided impingement at L5-S1 is the right lateral recess disc protrusion which extends caudad.  I have explained the findings to him.  I will have him see the neurosurgeon.  I have independently reviewed the MRI.    I will refill his pain medicine.  Continue the Naprosyn and Flexeril.  He has lower back pain, more on the right, NV intact, SLR weakly positive at 30 on the right, no spasm, ROM decreased secondary to pain.  Encounter Diagnosis  Name Primary?   Lumbar pain with radiation down right leg Yes   I have reviewed the [facility] Controlled Substance Reporting System web site prior to prescribing narcotic medicine for this patient.  To see neurosurgeon.  Call if any problem.  Precautions discussed.  Electronically Signed Darreld Mclean, MD 7/19/20239:24 AM

## 2022-02-27 ENCOUNTER — Encounter: Payer: Self-pay | Admitting: *Deleted

## 2022-07-19 ENCOUNTER — Other Ambulatory Visit: Payer: Self-pay

## 2022-07-19 ENCOUNTER — Encounter (HOSPITAL_COMMUNITY): Payer: Self-pay | Admitting: Emergency Medicine

## 2022-07-19 ENCOUNTER — Emergency Department (HOSPITAL_COMMUNITY): Payer: Medicaid Other

## 2022-07-19 ENCOUNTER — Emergency Department (HOSPITAL_COMMUNITY)
Admission: EM | Admit: 2022-07-19 | Discharge: 2022-07-19 | Disposition: A | Discharge status: Home / Self Care | Payer: Medicaid Other | Attending: Emergency Medicine | Admitting: Emergency Medicine

## 2022-07-19 DIAGNOSIS — Z7982 Long term (current) use of aspirin: Secondary | ICD-10-CM | POA: Diagnosis not present

## 2022-07-19 DIAGNOSIS — F1210 Cannabis abuse, uncomplicated: Secondary | ICD-10-CM | POA: Diagnosis not present

## 2022-07-19 DIAGNOSIS — R1084 Generalized abdominal pain: Secondary | ICD-10-CM | POA: Insufficient documentation

## 2022-07-19 DIAGNOSIS — R112 Nausea with vomiting, unspecified: Secondary | ICD-10-CM | POA: Diagnosis not present

## 2022-07-19 DIAGNOSIS — D72829 Elevated white blood cell count, unspecified: Secondary | ICD-10-CM | POA: Insufficient documentation

## 2022-07-19 DIAGNOSIS — R109 Unspecified abdominal pain: Secondary | ICD-10-CM

## 2022-07-19 DIAGNOSIS — F1410 Cocaine abuse, uncomplicated: Secondary | ICD-10-CM | POA: Diagnosis not present

## 2022-07-19 LAB — URINALYSIS, ROUTINE W REFLEX MICROSCOPIC
Bacteria, UA: NONE SEEN
Bilirubin Urine: NEGATIVE
Glucose, UA: NEGATIVE mg/dL
Hgb urine dipstick: NEGATIVE
Ketones, ur: 20 mg/dL — AB
Leukocytes,Ua: NEGATIVE
Nitrite: NEGATIVE
Protein, ur: >=300 mg/dL — AB
Specific Gravity, Urine: 1.018 (ref 1.005–1.030)
pH: 9 — ABNORMAL HIGH (ref 5.0–8.0)

## 2022-07-19 LAB — COMPREHENSIVE METABOLIC PANEL
ALT: 19 U/L (ref 0–44)
AST: 33 U/L (ref 15–41)
Albumin: 4.9 g/dL (ref 3.5–5.0)
Alkaline Phosphatase: 96 U/L (ref 38–126)
Anion gap: 16 — ABNORMAL HIGH (ref 5–15)
BUN: 13 mg/dL (ref 6–20)
CO2: 22 mmol/L (ref 22–32)
Calcium: 9.7 mg/dL (ref 8.9–10.3)
Chloride: 100 mmol/L (ref 98–111)
Creatinine, Ser: 0.89 mg/dL (ref 0.61–1.24)
GFR, Estimated: >60 mL/min (ref >60)
Glucose, Bld: 167 mg/dL — ABNORMAL HIGH (ref 70–99)
Potassium: 3.6 mmol/L (ref 3.5–5.1)
Sodium: 138 mmol/L (ref 135–145)
Total Bilirubin: 1 mg/dL (ref 0.3–1.2)
Total Protein: 8.9 g/dL — ABNORMAL HIGH (ref 6.5–8.1)

## 2022-07-19 LAB — CBC
HCT: 47.8 % (ref 39.0–52.0)
Hemoglobin: 16.4 g/dL (ref 13.0–17.0)
MCH: 29.7 pg (ref 26.0–34.0)
MCHC: 34.3 g/dL (ref 30.0–36.0)
MCV: 86.4 fL (ref 80.0–100.0)
Platelets: 230 10*3/uL (ref 150–400)
RBC: 5.53 MIL/uL (ref 4.22–5.81)
RDW: 14 % (ref 11.5–15.5)
WBC: 12.7 10*3/uL — ABNORMAL HIGH (ref 4.0–10.5)
nRBC: 0 % (ref 0.0–0.2)

## 2022-07-19 LAB — RAPID URINE DRUG SCREEN, HOSP PERFORMED
Amphetamines: NOT DETECTED
Barbiturates: NOT DETECTED
Benzodiazepines: NOT DETECTED
Cocaine: POSITIVE — AB
Opiates: NOT DETECTED
Tetrahydrocannabinol: POSITIVE — AB

## 2022-07-19 LAB — TROPONIN I (HIGH SENSITIVITY): Troponin I (High Sensitivity): 3 ng/L (ref <18)

## 2022-07-19 LAB — CK: Total CK: 222 U/L (ref 49–397)

## 2022-07-19 LAB — LIPASE, BLOOD: Lipase: 33 U/L (ref 11–51)

## 2022-07-19 MED ORDER — ONDANSETRON HCL 4 MG/2ML IJ SOLN
4.0000 mg | Freq: Once | INTRAMUSCULAR | Status: AC
  Administered 2022-07-19: 4 mg via INTRAVENOUS
  Filled 2022-07-19: qty 2

## 2022-07-19 MED ORDER — IOHEXOL 300 MG/ML  SOLN
100.0000 mL | Freq: Once | INTRAMUSCULAR | Status: AC | PRN
  Administered 2022-07-19: 100 mL via INTRAVENOUS

## 2022-07-19 MED ORDER — SODIUM CHLORIDE 0.9 % IV BOLUS
1000.0000 mL | Freq: Once | INTRAVENOUS | Status: AC
  Administered 2022-07-19: 1000 mL via INTRAVENOUS

## 2022-07-19 MED ORDER — HYDROMORPHONE HCL 1 MG/ML IJ SOLN
1.0000 mg | Freq: Once | INTRAMUSCULAR | Status: AC
  Administered 2022-07-19: 1 mg via INTRAVENOUS
  Filled 2022-07-19: qty 1

## 2022-07-19 MED ORDER — KETOROLAC TROMETHAMINE 30 MG/ML IJ SOLN
30.0000 mg | Freq: Once | INTRAMUSCULAR | Status: AC
  Administered 2022-07-19: 30 mg via INTRAVENOUS
  Filled 2022-07-19: qty 1

## 2022-07-19 NOTE — ED Notes (Signed)
Pt back from CT

## 2022-07-19 NOTE — ED Provider Notes (Signed)
[facility] Provider Note   CSN: [id number] Arrival date & time: 07/19/22  2297     History  Chief Complaint  Patient presents with   Abdominal Pain   HPI Rodney Carter is a 42 y.o. male with history of pancreatitis and chronic abdominal pain presenting for abdominal pain.  Started last night.  Pain is mostly in the right lower quadrant but also endorses generalized abdominal pain.  Endorses nausea and vomiting but no diarrhea.  Denies fever and urinary symptoms.  States the pain feels dull.  Denies recent alcohol consumption and marijuana use.   Abdominal Pain      Home Medications Prior to Admission medications   Medication Sig Start Date End Date Taking? Authorizing Provider  Aspirin-Salicylamide-Caffeine (ARTHRITIS STRENGTH BC POWDER PO) Take 1 packet by mouth daily as needed (mild pain).    [provider]  cyclobenzaprine (FLEXERIL) 10 MG tablet Take 1 tablet (10 mg total) by mouth at bedtime. One tablet every night at bedtime as needed for spasm. 11/30/21   Sanjuana Kava, MD  HYDROcodone-acetaminophen (NORCO/VICODIN) 5-325 MG tablet One tablet every six hours for pain.  Limit 7 days. 12/28/21   Sanjuana Kava, MD  naproxen (NAPROSYN) 500 MG tablet Take 1 tablet (500 mg total) by mouth 2 (two) times daily with a meal. 11/30/21   Sanjuana Kava, MD  pantoprazole (PROTONIX) 40 MG tablet Take 1 tablet (40 mg total) by mouth 2 (two) times daily before a meal. 08/18/21 02/14/22  Eloise Harman, DO      Allergies    Patient has no known allergies.    Review of Systems   Review of Systems  Gastrointestinal:  Positive for abdominal pain.    Physical Exam   Vitals:   07/19/22 1310 07/19/22 1342  BP: 138/83   Pulse: 61   Resp: 16   Temp:  98.2 F (36.8 C)  SpO2: 99%     CONSTITUTIONAL: well-appearing, NAD NEURO:  Alert and oriented x 3, CN 3-12 grossly intact EYES:  eyes equal and reactive ENT/NECK:  Supple, no  stridor  CARDIO:  tachycardia and regular rhythm, appears well-perfused  PULM:  No respiratory distress, CTAB GI/GU:  non-distended, soft, generalized tenderness MSK/SPINE:  No gross deformities, no edema, moves all extremities  SKIN:  no rash, atraumatic   *Additional and/or pertinent findings included in MDM below    ED Results / Procedures / Treatments   Labs (all labs ordered are listed, but only abnormal results are displayed) Labs Reviewed  COMPREHENSIVE METABOLIC PANEL - Abnormal; Notable for the following components:      Result Value   Glucose, Bld 167 (*)    Total Protein 8.9 (*)    Anion gap 16 (*)    All other components within normal limits  CBC - Abnormal; Notable for the following components:   WBC 12.7 (*)    All other components within normal limits  URINALYSIS, ROUTINE W REFLEX MICROSCOPIC - Abnormal; Notable for the following components:   pH 9.0 (*)    Ketones, ur 20 (*)    Protein, ur >=300 (*)    All other components within normal limits  RAPID URINE DRUG SCREEN, HOSP PERFORMED - Abnormal; Notable for the following components:   Cocaine POSITIVE (*)    Tetrahydrocannabinol POSITIVE (*)    All other components within normal limits  LIPASE, BLOOD  CK  TROPONIN I (HIGH SENSITIVITY)    EKG None  Radiology CT  Abdomen Pelvis W Contrast  Result Date: 07/19/2022 CLINICAL DATA:  Right lower quadrant abdominal pain. EXAM: CT ABDOMEN AND PELVIS WITH CONTRAST TECHNIQUE: Multidetector CT imaging of the abdomen and pelvis was performed using the standard protocol following bolus administration of intravenous contrast. RADIATION DOSE REDUCTION: This exam was performed according to the departmental dose-optimization program which includes automated exposure control, adjustment of the mA and/or kV according to patient size and/or use of iterative reconstruction technique. CONTRAST:  161mL OMNIPAQUE IOHEXOL 300 MG/ML  SOLN COMPARISON:  CT abdomen pelvis May 08, 2022 FINDINGS: Lower chest: No acute abnormality. Hepatobiliary: No suspicious hepatic lesion. Enlargement of the caudate lobe of the liver with mild fissural widening similar prior may reflect early changes of cirrhosis. Gallbladder is unremarkable. No biliary ductal dilation. Pancreas: No pancreatic ductal dilation or evidence of acute inflammation. Spleen: No splenomegaly. Adrenals/Urinary Tract: Bilateral adrenal glands unremarkable. No hydronephrosis. Stable benign exophytic left upper pole renal cyst requires no independent imaging follow-up. Punctate nonobstructive bilateral renal calculi. Mild symmetric wall thickening of an incompletely distended urinary bladder. Stomach/Bowel: Stomach is unremarkable for degree of distension. No pathologic dilation of large or small bowel. Prior small bowel resection with anastomotic sutures in the left upper quadrant. Short segment small bowel small bowel intussusception in the left upper quadrant associated with small bowel anastomotic sutures on image 48/5. Small bowel small bowel intussusception in the left hemiabdomen for instance on image 54/2. Normal appendix. Mild pancolonic wall thickening. Colonic diverticulosis without findings of acute diverticulitis. Vascular/Lymphatic: Normal caliber abdominal aorta. Smooth IVC contours. No pathologically enlarged abdominal lymph nodes. Reproductive: Prostate is unremarkable. Other: No significant abdominopelvic free fluid. Musculoskeletal: L4-L5 and L5-S1 discogenic disease. IMPRESSION: 1. Mild pancolonic wall thickening compatible with nonspecific infectious or inflammatory colitis. 2. Long segment small bowel-small bowel intussusception in the left hemiabdomen without evidence of obstruction, commonly transient in an adult and no lead point is identified. However, an occult lead point should be considered in an intussusception of this length, would suggest further evaluation with nonemergent CT enterography 3. No evidence  of bowel obstruction. 4. Mild symmetric wall thickening of an incompletely distended urinary bladder, suggest correlation for cystitis. 5. Punctate nonobstructive bilateral renal calculi. 6. Enlargement of the caudate lobe of the liver with mild fissural widening similar to prior may reflect early changes of cirrhosis. Electronically Signed   By: Dahlia Bailiff M.D.   On: 07/19/2022 12:12    Procedures Procedures    Medications Ordered in ED Medications  sodium chloride 0.9 % bolus 1,000 mL (1,000 mLs Intravenous Bolus 07/19/22 1052)  ondansetron (ZOFRAN) injection 4 mg (4 mg Intravenous Given 07/19/22 1053)  ketorolac (TORADOL) 30 MG/ML injection 30 mg (30 mg Intravenous Given 07/19/22 1053)  HYDROmorphone (DILAUDID) injection 1 mg (1 mg Intravenous Given 07/19/22 1309)  iohexol (OMNIPAQUE) 300 MG/ML solution 100 mL (100 mLs Intravenous Contrast Given 07/19/22 1134)    ED Course/ Medical Decision Making/ A&P                             Medical Decision Making Amount and/or Complexity of Data Reviewed Labs: ordered. Radiology: ordered.  Risk Prescription drug management.   Initial Impression and Ddx 42 year old male who is well-appearing and hemodynamically stable presenting for abdominal pain.  Exam notable for generalized tenderness to the abdomen.  DDx includes pancreatitis, appendicitis, bowel obstruction, nephrolithiasis and pyelonephritis.  Patient PMH that increases complexity of ED encounter: History of chronic abdominal pain  and acute pancreatitis  Interpretation of Diagnostics I independent reviewed and interpreted the labs as followed: Leukocytosis, positive for cocaine and THC in the urine  - I independently visualized the following imaging with scope of interpretation limited to determining acute life threatening conditions related to emergency care: CT abdomen pelvis, which revealed pancolonic wall thickening consistent with infection or inflammatory colitis and small bowel  intussusception  Patient Reassessment and Ultimate Disposition/Management Treated abdominal pain with Toradol and Dilaudid.  Volume resuscitated with normal saline bolus.  Treated as nausea with Zofran.  Stated he felt much better after treatment.  Discussed CT findings with patient also. Reached out to Dr. Gala Romney to discuss CT findings. Stated at this time he does not feel like he needs admission or intervention.  Stated that he will coordinate with his office to schedule an appointment later this week for follow-up.  Discussed risk of cocaine use.  Advised him to discontinue.  Fluid challenge with no issue.  Discharged with stable vitals.  Initially was intermittently hypoxic into the high 80s while asleep.  When aroused, sats improved quickly to high 90s.  Placed on 2 L nasal cannula for a brief period of time.  Transition back to room air with no issue.  Desat to high 90s at discharge.  Patient management required discussion with the following services or consulting groups:  Gastroenterology  Complexity of Problems Addressed Acute complicated illness or Injury  Additional Data Reviewed and Analyzed Further history obtained from: Further history from spouse/family member, Past medical history and medications listed in the EMR, and Prior ED visit notes  Patient Encounter Risk Assessment Consideration of hospitalization        Final Clinical Impression(s) / ED Diagnoses Final diagnoses:  Abdominal pain, unspecified abdominal location    Rx / DC Orders ED Discharge Orders     None         Harriet Pho, PA-C 07/19/22 1502    Milton Ferguson, MD 07/19/22 1626

## 2022-07-19 NOTE — ED Triage Notes (Signed)
Patient c/o lower abd pain with spasms with cramping in muscle in face. Per patient nausea and vomiting. Denies any diarrhea, fevers, or urinary symptoms.

## 2022-07-19 NOTE — Discharge Instructions (Signed)
Evaluation for your abdominal pain was overall reassuring.  CT scan did show evidence of some inflammation in your large bowel along with intussusception possibly in the small intestines.  I talked to Dr. Gala Romney, gastroenterologist here in town who advised to set up an appointment for later this week to be evaluated in his office.  At this time he did not feel admission or treatment is required.  If you have worsening abdominal pain, bloody stools, blood in your urine or nausea and vomiting or any other concerning symptom please return the emergency department for evaluation.

## 2022-07-19 NOTE — ED Notes (Signed)
Patient sats drop to 82% on RA while sleeping. PA made aware. Pt placed on 2L of O2

## 2022-07-19 NOTE — ED Notes (Signed)
Pt d/c home per MD order. Discharge summary reviewed, pt verbalizes understanding. Ambulatory off unit. No s/s of acute distress noted at discharge.  °

## 2022-07-19 NOTE — ED Notes (Signed)
Patient to CT at this time.

## 2022-08-10 ENCOUNTER — Encounter: Payer: Self-pay | Admitting: Radiology

## 2022-08-23 ENCOUNTER — Telehealth: Payer: Self-pay | Admitting: Orthopaedic Surgery

## 2022-08-23 NOTE — Telephone Encounter (Signed)
Someone lvm regarding this patient today, I could only understand numbers and bits and pieces.  The accent was super strong.  I called [phone] and lvm.  I think she needed something regarding billing so I left the billing number for her.

## 2022-09-21 ENCOUNTER — Emergency Department (HOSPITAL_COMMUNITY): Payer: Medicaid Other

## 2022-09-21 ENCOUNTER — Encounter (HOSPITAL_COMMUNITY): Payer: Self-pay | Admitting: *Deleted

## 2022-09-21 ENCOUNTER — Emergency Department (HOSPITAL_COMMUNITY)
Admission: EM | Admit: 2022-09-21 | Discharge: 2022-09-21 | Disposition: A | Discharge status: Home / Self Care | Payer: Medicaid Other | Attending: Emergency Medicine | Admitting: Emergency Medicine

## 2022-09-21 ENCOUNTER — Other Ambulatory Visit: Payer: Self-pay

## 2022-09-21 DIAGNOSIS — Z7982 Long term (current) use of aspirin: Secondary | ICD-10-CM | POA: Diagnosis not present

## 2022-09-21 DIAGNOSIS — N281 Cyst of kidney, acquired: Secondary | ICD-10-CM | POA: Diagnosis not present

## 2022-09-21 DIAGNOSIS — R1084 Generalized abdominal pain: Secondary | ICD-10-CM

## 2022-09-21 LAB — COMPREHENSIVE METABOLIC PANEL
ALT: 15 U/L (ref 0–44)
AST: 30 U/L (ref 15–41)
Albumin: 3.6 g/dL (ref 3.5–5.0)
Alkaline Phosphatase: 71 U/L (ref 38–126)
Anion gap: 13 (ref 5–15)
BUN: 9 mg/dL (ref 6–20)
CO2: 20 mmol/L — ABNORMAL LOW (ref 22–32)
Calcium: 8.7 mg/dL — ABNORMAL LOW (ref 8.9–10.3)
Chloride: 108 mmol/L (ref 98–111)
Creatinine, Ser: 0.96 mg/dL (ref 0.61–1.24)
GFR, Estimated: >60 mL/min (ref >60)
Glucose, Bld: 104 mg/dL — ABNORMAL HIGH (ref 70–99)
Potassium: 4.3 mmol/L (ref 3.5–5.1)
Sodium: 141 mmol/L (ref 135–145)
Total Bilirubin: 0.9 mg/dL (ref 0.3–1.2)
Total Protein: 6.4 g/dL — ABNORMAL LOW (ref 6.5–8.1)

## 2022-09-21 LAB — CBC
HCT: 44.2 % (ref 39.0–52.0)
Hemoglobin: 14.1 g/dL (ref 13.0–17.0)
MCH: 29.1 pg (ref 26.0–34.0)
MCHC: 31.9 g/dL (ref 30.0–36.0)
MCV: 91.3 fL (ref 80.0–100.0)
Platelets: 233 10*3/uL (ref 150–400)
RBC: 4.84 MIL/uL (ref 4.22–5.81)
RDW: 14.2 % (ref 11.5–15.5)
WBC: 9.2 10*3/uL (ref 4.0–10.5)
nRBC: 0 % (ref 0.0–0.2)

## 2022-09-21 LAB — RAPID URINE DRUG SCREEN, HOSP PERFORMED
Amphetamines: NOT DETECTED
Barbiturates: NOT DETECTED
Benzodiazepines: NOT DETECTED
Cocaine: POSITIVE — AB
Opiates: NOT DETECTED
Tetrahydrocannabinol: POSITIVE — AB

## 2022-09-21 LAB — LIPASE, BLOOD: Lipase: 29 U/L (ref 11–51)

## 2022-09-21 MED ORDER — ONDANSETRON 4 MG PO TBDP
4.0000 mg | ORAL_TABLET | Freq: Once | ORAL | Status: AC
  Administered 2022-09-21: 4 mg via ORAL
  Filled 2022-09-21: qty 1

## 2022-09-21 MED ORDER — LACTATED RINGERS IV BOLUS
1000.0000 mL | Freq: Once | INTRAVENOUS | Status: AC
  Administered 2022-09-21: 1000 mL via INTRAVENOUS

## 2022-09-21 MED ORDER — HYDROMORPHONE HCL 1 MG/ML IJ SOLN
0.5000 mg | Freq: Once | INTRAMUSCULAR | Status: AC
  Administered 2022-09-21: 0.5 mg via INTRAVENOUS
  Filled 2022-09-21: qty 1

## 2022-09-21 MED ORDER — OXYCODONE-ACETAMINOPHEN 5-325 MG PO TABS: 1.0000 | ORAL_TABLET | Freq: Four times a day (QID) | ORAL | 0 refills | Status: DC | PRN

## 2022-09-21 MED ORDER — ONDANSETRON HCL 4 MG/2ML IJ SOLN
4.0000 mg | Freq: Once | INTRAMUSCULAR | Status: AC
  Administered 2022-09-21: 4 mg via INTRAVENOUS
  Filled 2022-09-21: qty 2

## 2022-09-21 MED ORDER — OXYCODONE-ACETAMINOPHEN 5-325 MG PO TABS
1.0000 | ORAL_TABLET | Freq: Once | ORAL | Status: AC
  Administered 2022-09-21: 1 via ORAL
  Filled 2022-09-21: qty 1

## 2022-09-21 MED ORDER — ONDANSETRON 4 MG PO TBDP: 4.0000 mg | ORAL_TABLET | Freq: Three times a day (TID) | ORAL | 0 refills | Status: DC | PRN

## 2022-09-21 MED ORDER — IOHEXOL 350 MG/ML SOLN
75.0000 mL | Freq: Once | INTRAVENOUS | Status: AC | PRN
  Administered 2022-09-21: 75 mL via INTRAVENOUS

## 2022-09-21 NOTE — ED Notes (Signed)
Pt gone to CT 

## 2022-09-21 NOTE — Discharge Instructions (Signed)
Please take your medications as prescribed. Take tylenol/ibuprofen or percocet as needed for pain. I recommend close follow-up with PCP for reevaluation.  Please do not hesitate to return to emergency department if worrisome signs symptoms we discussed become apparent.  
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
## 2022-09-21 NOTE — ED Provider Triage Note (Signed)
Emergency Medicine Provider Triage Evaluation Note  Rodney Carter , a 42 y.o. male  was evaluated in triage.  Pt complains of upper abd pain and nausea/vomiting. Hx CHS.   Review of Systems  Positive: Abd pain and nv.  Negative: fever  Physical Exam  BP (!) 157/106 (BP Location: Left Arm)   Pulse 86   Temp 97.7 F (36.5 C) (Oral)   Resp 18   SpO2 100%  Gen:   Awake, no distress   Resp:  Normal effort  MSK:   Moves extremities without difficulty  Other:  Abd soft, mild upper abd tenderness.   Medical Decision Making  Medically screening exam initiated at 4:08 PM.  Appropriate orders placed.  Santanna Scarce Lichtenberg was informed that the remainder of the evaluation will be completed by another provider, this initial triage assessment does not replace that evaluation, and the importance of remaining in the ED until their evaluation is complete.     Cathren Laine, MD 09/21/22 [phone]

## 2022-11-11 ENCOUNTER — Emergency Department (HOSPITAL_COMMUNITY): Payer: Medicaid Other

## 2022-11-11 ENCOUNTER — Emergency Department (HOSPITAL_COMMUNITY)
Admission: EM | Admit: 2022-11-11 | Discharge: 2022-11-11 | Disposition: A | Discharge status: Home / Self Care | Payer: Medicaid Other | Attending: Emergency Medicine | Admitting: Emergency Medicine

## 2022-11-11 ENCOUNTER — Encounter (HOSPITAL_COMMUNITY): Payer: Self-pay | Admitting: Emergency Medicine

## 2022-11-11 ENCOUNTER — Other Ambulatory Visit: Payer: Self-pay

## 2022-11-11 DIAGNOSIS — M542 Cervicalgia: Secondary | ICD-10-CM | POA: Diagnosis present

## 2022-11-11 DIAGNOSIS — Z7982 Long term (current) use of aspirin: Secondary | ICD-10-CM | POA: Insufficient documentation

## 2022-11-11 DIAGNOSIS — R519 Headache, unspecified: Secondary | ICD-10-CM | POA: Insufficient documentation

## 2022-11-11 DIAGNOSIS — S1093XA Contusion of unspecified part of neck, initial encounter: Secondary | ICD-10-CM | POA: Diagnosis not present

## 2022-11-11 MED ORDER — KETOROLAC TROMETHAMINE 60 MG/2ML IM SOLN
30.0000 mg | Freq: Once | INTRAMUSCULAR | Status: AC
  Administered 2022-11-11: 30 mg via INTRAMUSCULAR
  Filled 2022-11-11: qty 2

## 2022-11-11 MED ORDER — CYCLOBENZAPRINE HCL 10 MG PO TABS: 10.0000 mg | ORAL_TABLET | Freq: Two times a day (BID) | ORAL | 0 refills | Status: DC | PRN

## 2022-11-11 MED ORDER — CYCLOBENZAPRINE HCL 10 MG PO TABS
10.0000 mg | ORAL_TABLET | Freq: Once | ORAL | Status: AC
  Administered 2022-11-11: 10 mg via ORAL
  Filled 2022-11-11: qty 1

## 2022-11-11 MED ORDER — LIDOCAINE 5 % EX PTCH
1.0000 | MEDICATED_PATCH | CUTANEOUS | Status: DC
  Administered 2022-11-11: 1 via TRANSDERMAL
  Filled 2022-11-11: qty 1
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
## 2022-11-11 NOTE — Discharge Instructions (Addendum)
You have been seen today for your complaint of neck pain, headache. Your imaging was reassuring and showed no acute abnormalities. Your discharge medications include Flexeril.  This is a muscle relaxer.  Take it as needed.  Do not drive or operate heavy machinery while taking this medication. Alternate tylenol and ibuprofen for pain. You may alternate these every 4 hours. You may take up to 800 mg of ibuprofen at a time and up to 1000 mg of tylenol. Use lidocaine patches as needed.  This is an over-the-counter medication that you may find in any pharmacy.  Apply 1 patch every 12 hours to your neck. Follow up with: Your primary care provider in 1 week for reevaluation Please seek immediate medical care if you develop any of the following symptoms: You have sudden trouble with breathing. Your swelling gets worse. You have noisy breathing. You cough up blood. You cannot swallow. You vomit. You are dizzy. You faint. You have: A drooping face. Weakness on one side of your body. Trouble speaking. Trouble understanding what people say. At this time there does not appear to be the presence of an emergent medical condition, however there is always the potential for conditions to change. Please read and follow the below instructions.  Do not take your medicine if  develop an itchy rash, swelling in your mouth or lips, or difficulty breathing; call 911 and seek immediate emergency medical attention if this occurs.  You may review your lab tests and imaging results in their entirety on your MyChart account.  Please discuss all results of fully with your primary care provider and other specialist at your follow-up visit.  Note: Portions of this text may have been transcribed using voice recognition software. Every effort was made to ensure accuracy; however, inadvertent computerized transcription errors may still be present.
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
## 2022-12-06 ENCOUNTER — Encounter (HOSPITAL_COMMUNITY): Payer: Self-pay

## 2022-12-06 ENCOUNTER — Emergency Department (HOSPITAL_COMMUNITY)
Admission: EM | Admit: 2022-12-06 | Discharge: 2022-12-06 | Disposition: A | Discharge status: Home / Self Care | Payer: Medicaid Other | Attending: Emergency Medicine | Admitting: Emergency Medicine

## 2022-12-06 ENCOUNTER — Other Ambulatory Visit: Payer: Self-pay

## 2022-12-06 ENCOUNTER — Emergency Department (HOSPITAL_COMMUNITY): Payer: Medicaid Other

## 2022-12-06 DIAGNOSIS — K219 Gastro-esophageal reflux disease without esophagitis: Secondary | ICD-10-CM | POA: Insufficient documentation

## 2022-12-06 DIAGNOSIS — R1032 Left lower quadrant pain: Secondary | ICD-10-CM | POA: Diagnosis present

## 2022-12-06 DIAGNOSIS — R1013 Epigastric pain: Secondary | ICD-10-CM | POA: Insufficient documentation

## 2022-12-06 DIAGNOSIS — K5900 Constipation, unspecified: Secondary | ICD-10-CM | POA: Diagnosis not present

## 2022-12-06 DIAGNOSIS — R112 Nausea with vomiting, unspecified: Secondary | ICD-10-CM | POA: Insufficient documentation

## 2022-12-06 DIAGNOSIS — R1030 Lower abdominal pain, unspecified: Secondary | ICD-10-CM

## 2022-12-06 LAB — URINALYSIS, ROUTINE W REFLEX MICROSCOPIC
Bacteria, UA: NONE SEEN
Bilirubin Urine: NEGATIVE
Glucose, UA: NEGATIVE mg/dL
Hgb urine dipstick: NEGATIVE
Ketones, ur: 20 mg/dL — AB
Leukocytes,Ua: NEGATIVE
Nitrite: NEGATIVE
Protein, ur: 30 mg/dL — AB
Specific Gravity, Urine: 1.013 (ref 1.005–1.030)
pH: 9 — ABNORMAL HIGH (ref 5.0–8.0)

## 2022-12-06 LAB — CBC
HCT: 44.9 % (ref 39.0–52.0)
Hemoglobin: 14.9 g/dL (ref 13.0–17.0)
MCH: 29.8 pg (ref 26.0–34.0)
MCHC: 33.2 g/dL (ref 30.0–36.0)
MCV: 89.8 fL (ref 80.0–100.0)
Platelets: 262 10*3/uL (ref 150–400)
RBC: 5 MIL/uL (ref 4.22–5.81)
RDW: 14.2 % (ref 11.5–15.5)
WBC: 12.2 10*3/uL — ABNORMAL HIGH (ref 4.0–10.5)
nRBC: 0 % (ref 0.0–0.2)

## 2022-12-06 LAB — LIPASE, BLOOD: Lipase: 46 U/L (ref 11–51)

## 2022-12-06 LAB — COMPREHENSIVE METABOLIC PANEL
ALT: 15 U/L (ref 0–44)
AST: 17 U/L (ref 15–41)
Albumin: 4 g/dL (ref 3.5–5.0)
Alkaline Phosphatase: 75 U/L (ref 38–126)
Anion gap: 9 (ref 5–15)
BUN: 9 mg/dL (ref 6–20)
CO2: 24 mmol/L (ref 22–32)
Calcium: 8.2 mg/dL — ABNORMAL LOW (ref 8.9–10.3)
Chloride: 105 mmol/L (ref 98–111)
Creatinine, Ser: 0.74 mg/dL (ref 0.61–1.24)
GFR, Estimated: >60 mL/min (ref >60)
Glucose, Bld: 119 mg/dL — ABNORMAL HIGH (ref 70–99)
Potassium: 3.2 mmol/L — ABNORMAL LOW (ref 3.5–5.1)
Sodium: 138 mmol/L (ref 135–145)
Total Bilirubin: 0.9 mg/dL (ref 0.3–1.2)
Total Protein: 6.8 g/dL (ref 6.5–8.1)

## 2022-12-06 LAB — MAGNESIUM: Magnesium: 1.8 mg/dL (ref 1.7–2.4)

## 2022-12-06 MED ORDER — SODIUM CHLORIDE 0.9 % IV BOLUS
1000.0000 mL | Freq: Once | INTRAVENOUS | Status: AC
  Administered 2022-12-06: 1000 mL via INTRAVENOUS

## 2022-12-06 MED ORDER — HYDROMORPHONE HCL 1 MG/ML IJ SOLN
1.0000 mg | Freq: Once | INTRAMUSCULAR | Status: AC
  Administered 2022-12-06: 1 mg via INTRAVENOUS
  Filled 2022-12-06: qty 1

## 2022-12-06 MED ORDER — ONDANSETRON HCL 4 MG/2ML IJ SOLN
4.0000 mg | Freq: Once | INTRAMUSCULAR | Status: AC
  Administered 2022-12-06: 4 mg via INTRAVENOUS
  Filled 2022-12-06: qty 2

## 2022-12-06 NOTE — ED Provider Triage Note (Signed)
Emergency Medicine Provider Triage Evaluation Note  Rodney Carter , a 42 y.o. male  was evaluated in triage.  Pt complains of left lower abdominal pain nausea and vomiting.  Symptoms have been present for 2 to 3 days.  Seen at [facility] ER on Monday had CT of his abdomen and told he had kidney stones.  He was given pain medication and nausea medicine which have not helped his symptoms.  He is here today because his pain is getting worse.  Describes constant nonradiating pain of his left lower abdomen.  Denies diarrhea or dysuria symptoms.  Review of Systems  Positive: Left lower abdominal pain, nausea, vomiting Negative: Chest pain, shortness of breath, dysuria, fever  Physical Exam  BP (!) 170/94 (BP Location: Right Arm)   Pulse (!) 58   Temp 98.3 F (36.8 C) (Oral)   Resp 16   Ht 5\' 11"  (1.803 m)   Wt 74.8 kg   SpO2 100%   BMI 23.01 kg/m  Gen:   Awake, no distress patient is tearful Resp:  Normal effort  MSK:   Moves extremities without difficulty  Other:  Tender to palpation left lower quadrant  Medical Decision Making  Medically screening exam initiated at 3:42 PM.  Appropriate orders placed.  Rodney Carter was informed that the remainder of the evaluation will be completed by another provider, this initial triage assessment does not replace that evaluation, and the importance of remaining in the ED until their evaluation is complete.     Pauline Aus, PA-C 12/06/22 1552

## 2022-12-06 NOTE — ED Notes (Signed)
Per lab, cmp was hemolyzed and will need recollecting

## 2022-12-06 NOTE — Discharge Instructions (Signed)
Your CT scan did not show any evidence of a bowel blockage.  However, did show some constipation.  I recommend over-the-counter MiraLAX, 1 heaping capful mixed in 8 to 10 ounces of water or juice 1-2 times a day until your constipation improves.  Please follow-up with your primary care provider for recheck, return to the emergency department for any new or worsening symptoms.
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
## 2022-12-06 NOTE — ED Notes (Signed)
Pt was difficult stick.

## 2022-12-06 NOTE — ED Notes (Signed)
Patient transported to CT 

## 2022-12-06 NOTE — ED Notes (Signed)
See triage notes. Pt restless. Comfort measures given.

## 2022-12-06 NOTE — ED Notes (Signed)
Patient verbalizes understanding of discharge instructions. Opportunity for questioning and answers were provided. Armband removed by staff, pt discharged from ED. Ambulated out to lobby with daughter  

## 2022-12-06 NOTE — ED Triage Notes (Signed)
Pt comes in with complaints of lower abdominal pain starting Monday. Pt was seen at [facility] and dx with small kidney stones and was given nausea and pan medication per significant other. Pt is unable to keep the nausea medication down. Pt has been vomiting since Monday with no relief.

## 2023-01-26 ENCOUNTER — Emergency Department (HOSPITAL_BASED_OUTPATIENT_CLINIC_OR_DEPARTMENT_OTHER)
Admission: EM | Admit: 2023-01-26 | Discharge: 2023-01-26 | Disposition: A | Discharge status: Home / Self Care | Payer: MEDICAID | Attending: Emergency Medicine | Admitting: Emergency Medicine

## 2023-01-26 ENCOUNTER — Encounter (HOSPITAL_BASED_OUTPATIENT_CLINIC_OR_DEPARTMENT_OTHER): Payer: Self-pay

## 2023-01-26 ENCOUNTER — Other Ambulatory Visit: Payer: Self-pay

## 2023-01-26 DIAGNOSIS — R112 Nausea with vomiting, unspecified: Secondary | ICD-10-CM | POA: Diagnosis not present

## 2023-01-26 LAB — URINALYSIS, ROUTINE W REFLEX MICROSCOPIC
Bilirubin Urine: NEGATIVE
Glucose, UA: NEGATIVE mg/dL
Ketones, ur: NEGATIVE mg/dL
Nitrite: NEGATIVE
Protein, ur: 30 mg/dL — AB
Specific Gravity, Urine: 1.02 (ref 1.005–1.030)
pH: 8.5 — ABNORMAL HIGH (ref 5.0–8.0)

## 2023-01-26 LAB — LIPASE, BLOOD: Lipase: 65 U/L — ABNORMAL HIGH (ref 11–51)

## 2023-01-26 LAB — COMPREHENSIVE METABOLIC PANEL
ALT: 18 U/L (ref 0–44)
AST: 21 U/L (ref 15–41)
Albumin: 4 g/dL (ref 3.5–5.0)
Alkaline Phosphatase: 102 U/L (ref 38–126)
Anion gap: 10 (ref 5–15)
BUN: 14 mg/dL (ref 6–20)
CO2: 27 mmol/L (ref 22–32)
Calcium: 9 mg/dL (ref 8.9–10.3)
Chloride: 102 mmol/L (ref 98–111)
Creatinine, Ser: 0.93 mg/dL (ref 0.61–1.24)
GFR, Estimated: >60 mL/min (ref >60)
Glucose, Bld: 107 mg/dL — ABNORMAL HIGH (ref 70–99)
Potassium: 4.4 mmol/L (ref 3.5–5.1)
Sodium: 139 mmol/L (ref 135–145)
Total Bilirubin: 0.3 mg/dL (ref 0.3–1.2)
Total Protein: 7.7 g/dL (ref 6.5–8.1)

## 2023-01-26 LAB — CBC
HCT: 45.8 % (ref 39.0–52.0)
Hemoglobin: 15.3 g/dL (ref 13.0–17.0)
MCH: 29.1 pg (ref 26.0–34.0)
MCHC: 33.4 g/dL (ref 30.0–36.0)
MCV: 87.1 fL (ref 80.0–100.0)
Platelets: 292 10*3/uL (ref 150–400)
RBC: 5.26 MIL/uL (ref 4.22–5.81)
RDW: 13.7 % (ref 11.5–15.5)
WBC: 6.6 10*3/uL (ref 4.0–10.5)
nRBC: 0 % (ref 0.0–0.2)

## 2023-01-26 LAB — URINALYSIS, MICROSCOPIC (REFLEX): WBC, UA: >50 WBC/hpf (ref 0–5)

## 2023-01-26 MED ORDER — ONDANSETRON HCL 4 MG PO TABS: 4.0000 mg | ORAL_TABLET | Freq: Four times a day (QID) | ORAL | 0 refills | Status: DC | PRN

## 2023-01-26 MED ORDER — DOXYCYCLINE HYCLATE 100 MG PO TABS
100.0000 mg | ORAL_TABLET | Freq: Once | ORAL | Status: AC
  Administered 2023-01-26: 100 mg via ORAL
  Filled 2023-01-26: qty 1

## 2023-01-26 MED ORDER — ONDANSETRON 4 MG PO TBDP
4.0000 mg | ORAL_TABLET | Freq: Once | ORAL | Status: AC
  Administered 2023-01-26: 4 mg via ORAL
  Filled 2023-01-26: qty 1

## 2023-01-26 MED ORDER — CEFTRIAXONE SODIUM 500 MG IJ SOLR
500.0000 mg | Freq: Once | INTRAMUSCULAR | Status: AC
  Administered 2023-01-26: 500 mg via INTRAMUSCULAR
  Filled 2023-01-26: qty 500

## 2023-01-26 MED ORDER — DOXYCYCLINE HYCLATE 100 MG PO CAPS: 100.0000 mg | ORAL_CAPSULE | Freq: Two times a day (BID) | ORAL | 0 refills | Status: DC

## 2023-01-26 NOTE — ED Notes (Signed)
PO challenge successful.
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
## 2023-01-26 NOTE — ED Triage Notes (Signed)
Patient stated he is vomiting today. No abd pain.

## 2023-01-27 LAB — HIV ANTIBODY (ROUTINE TESTING W REFLEX): HIV Screen 4th Generation wRfx: NONREACTIVE

## 2023-01-29 LAB — GC/CHLAMYDIA PROBE AMP (~~LOC~~) NOT AT ARMC
Chlamydia: POSITIVE — AB
Comment: NEGATIVE
Comment: NORMAL
Neisseria Gonorrhea: POSITIVE — AB

## 2023-02-23 ENCOUNTER — Emergency Department (HOSPITAL_COMMUNITY): Payer: MEDICAID

## 2023-02-23 ENCOUNTER — Encounter (HOSPITAL_COMMUNITY): Payer: Self-pay

## 2023-02-23 ENCOUNTER — Other Ambulatory Visit: Payer: Self-pay

## 2023-02-23 ENCOUNTER — Emergency Department (HOSPITAL_COMMUNITY)
Admission: EM | Admit: 2023-02-23 | Discharge: 2023-02-23 | Disposition: A | Discharge status: Home / Self Care | Payer: MEDICAID | Attending: Emergency Medicine | Admitting: Emergency Medicine

## 2023-02-23 DIAGNOSIS — Y900 Blood alcohol level of less than 20 mg/100 ml: Secondary | ICD-10-CM | POA: Diagnosis not present

## 2023-02-23 DIAGNOSIS — Z7982 Long term (current) use of aspirin: Secondary | ICD-10-CM | POA: Insufficient documentation

## 2023-02-23 DIAGNOSIS — R1030 Lower abdominal pain, unspecified: Secondary | ICD-10-CM | POA: Diagnosis not present

## 2023-02-23 DIAGNOSIS — F1210 Cannabis abuse, uncomplicated: Secondary | ICD-10-CM | POA: Diagnosis not present

## 2023-02-23 DIAGNOSIS — F1410 Cocaine abuse, uncomplicated: Secondary | ICD-10-CM | POA: Insufficient documentation

## 2023-02-23 DIAGNOSIS — R1084 Generalized abdominal pain: Secondary | ICD-10-CM | POA: Diagnosis present

## 2023-02-23 LAB — URINALYSIS, ROUTINE W REFLEX MICROSCOPIC
Bacteria, UA: NONE SEEN
Bilirubin Urine: NEGATIVE
Glucose, UA: NEGATIVE mg/dL
Hgb urine dipstick: NEGATIVE
Ketones, ur: 5 mg/dL — AB
Leukocytes,Ua: NEGATIVE
Nitrite: NEGATIVE
Protein, ur: 30 mg/dL — AB
Specific Gravity, Urine: >1.046 — ABNORMAL HIGH (ref 1.005–1.030)
pH: 8 (ref 5.0–8.0)

## 2023-02-23 LAB — COMPREHENSIVE METABOLIC PANEL
ALT: 21 U/L (ref 0–44)
AST: 24 U/L (ref 15–41)
Albumin: 4.6 g/dL (ref 3.5–5.0)
Alkaline Phosphatase: 100 U/L (ref 38–126)
Anion gap: 12 (ref 5–15)
BUN: 8 mg/dL (ref 6–20)
CO2: 21 mmol/L — ABNORMAL LOW (ref 22–32)
Calcium: 9.2 mg/dL (ref 8.9–10.3)
Chloride: 105 mmol/L (ref 98–111)
Creatinine, Ser: 0.92 mg/dL (ref 0.61–1.24)
GFR, Estimated: >60 mL/min (ref >60)
Glucose, Bld: 131 mg/dL — ABNORMAL HIGH (ref 70–99)
Potassium: 4.1 mmol/L (ref 3.5–5.1)
Sodium: 138 mmol/L (ref 135–145)
Total Bilirubin: 0.9 mg/dL (ref 0.3–1.2)
Total Protein: 8.1 g/dL (ref 6.5–8.1)

## 2023-02-23 LAB — CBC
HCT: 49.2 % (ref 39.0–52.0)
Hemoglobin: 16.2 g/dL (ref 13.0–17.0)
MCH: 28.8 pg (ref 26.0–34.0)
MCHC: 32.9 g/dL (ref 30.0–36.0)
MCV: 87.5 fL (ref 80.0–100.0)
Platelets: 197 10*3/uL (ref 150–400)
RBC: 5.62 MIL/uL (ref 4.22–5.81)
RDW: 14.1 % (ref 11.5–15.5)
WBC: 14 10*3/uL — ABNORMAL HIGH (ref 4.0–10.5)
nRBC: 0 % (ref 0.0–0.2)

## 2023-02-23 LAB — RAPID URINE DRUG SCREEN, HOSP PERFORMED
Amphetamines: NOT DETECTED
Barbiturates: NOT DETECTED
Benzodiazepines: NOT DETECTED
Cocaine: POSITIVE — AB
Opiates: NOT DETECTED
Tetrahydrocannabinol: POSITIVE — AB

## 2023-02-23 LAB — ETHANOL: Alcohol, Ethyl (B): <10 mg/dL (ref <10)

## 2023-02-23 LAB — CBG MONITORING, ED: Glucose-Capillary: 135 mg/dL — ABNORMAL HIGH (ref 70–99)

## 2023-02-23 LAB — LIPASE, BLOOD: Lipase: 26 U/L (ref 11–51)

## 2023-02-23 MED ORDER — DICYCLOMINE HCL 20 MG PO TABS: ORAL_TABLET | ORAL | 2 refills | Status: DC

## 2023-02-23 MED ORDER — ONDANSETRON 4 MG PO TBDP: ORAL_TABLET | ORAL | 0 refills | Status: DC

## 2023-02-23 MED ORDER — HYDROMORPHONE HCL 1 MG/ML IJ SOLN
1.0000 mg | Freq: Once | INTRAMUSCULAR | Status: AC
  Administered 2023-02-23: 1 mg via INTRAVENOUS
  Filled 2023-02-23: qty 1

## 2023-02-23 MED ORDER — HYDROMORPHONE HCL 1 MG/ML IJ SOLN
0.5000 mg | Freq: Once | INTRAMUSCULAR | Status: AC
  Administered 2023-02-23: 0.5 mg via INTRAVENOUS
  Filled 2023-02-23: qty 0.5

## 2023-02-23 MED ORDER — IOHEXOL 300 MG/ML  SOLN
100.0000 mL | Freq: Once | INTRAMUSCULAR | Status: AC | PRN
  Administered 2023-02-23: 100 mL via INTRAVENOUS

## 2023-02-23 MED ORDER — SODIUM CHLORIDE 0.9 % IV BOLUS
1000.0000 mL | Freq: Once | INTRAVENOUS | Status: AC
  Administered 2023-02-23: 1000 mL via INTRAVENOUS

## 2023-02-23 MED ORDER — LORAZEPAM 2 MG/ML IJ SOLN
0.5000 mg | Freq: Once | INTRAMUSCULAR | Status: AC
  Administered 2023-02-23: 0.5 mg via INTRAVENOUS
  Filled 2023-02-23: qty 1

## 2023-02-23 MED ORDER — ONDANSETRON HCL 4 MG/2ML IJ SOLN
4.0000 mg | Freq: Once | INTRAMUSCULAR | Status: AC
  Administered 2023-02-23: 4 mg via INTRAVENOUS
  Filled 2023-02-23: qty 2

## 2023-02-23 MED ORDER — PANTOPRAZOLE SODIUM 40 MG PO TBEC: 40.0000 mg | DELAYED_RELEASE_TABLET | Freq: Every day | ORAL | 1 refills | Status: AC
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
## 2023-02-23 NOTE — ED Notes (Signed)
Called from lab draw room in waiting room saying patient is unresponsive.  Patient was responsive to sternal rub.  Patient actively vomiting at this time and diaphoretic.  Cbg checked.  Patient placed back in lobby after lab draw. Patient A&O x4.

## 2023-02-23 NOTE — Discharge Instructions (Signed)
Follow-up with your gastroenterologist.  

## 2023-06-19 IMAGING — DX DG ABDOMEN ACUTE W/ 1V CHEST
3 series · 3 of 3 positions shown · non-contrast
Comparison: CT abdomen/pelvis dated 03/23/2021. Chest radiograph
dated 03/23/2021.

CLINICAL DATA: Abdominal pain

EXAM:
DG ABDOMEN ACUTE WITH 1 VIEW CHEST

[chest pa]
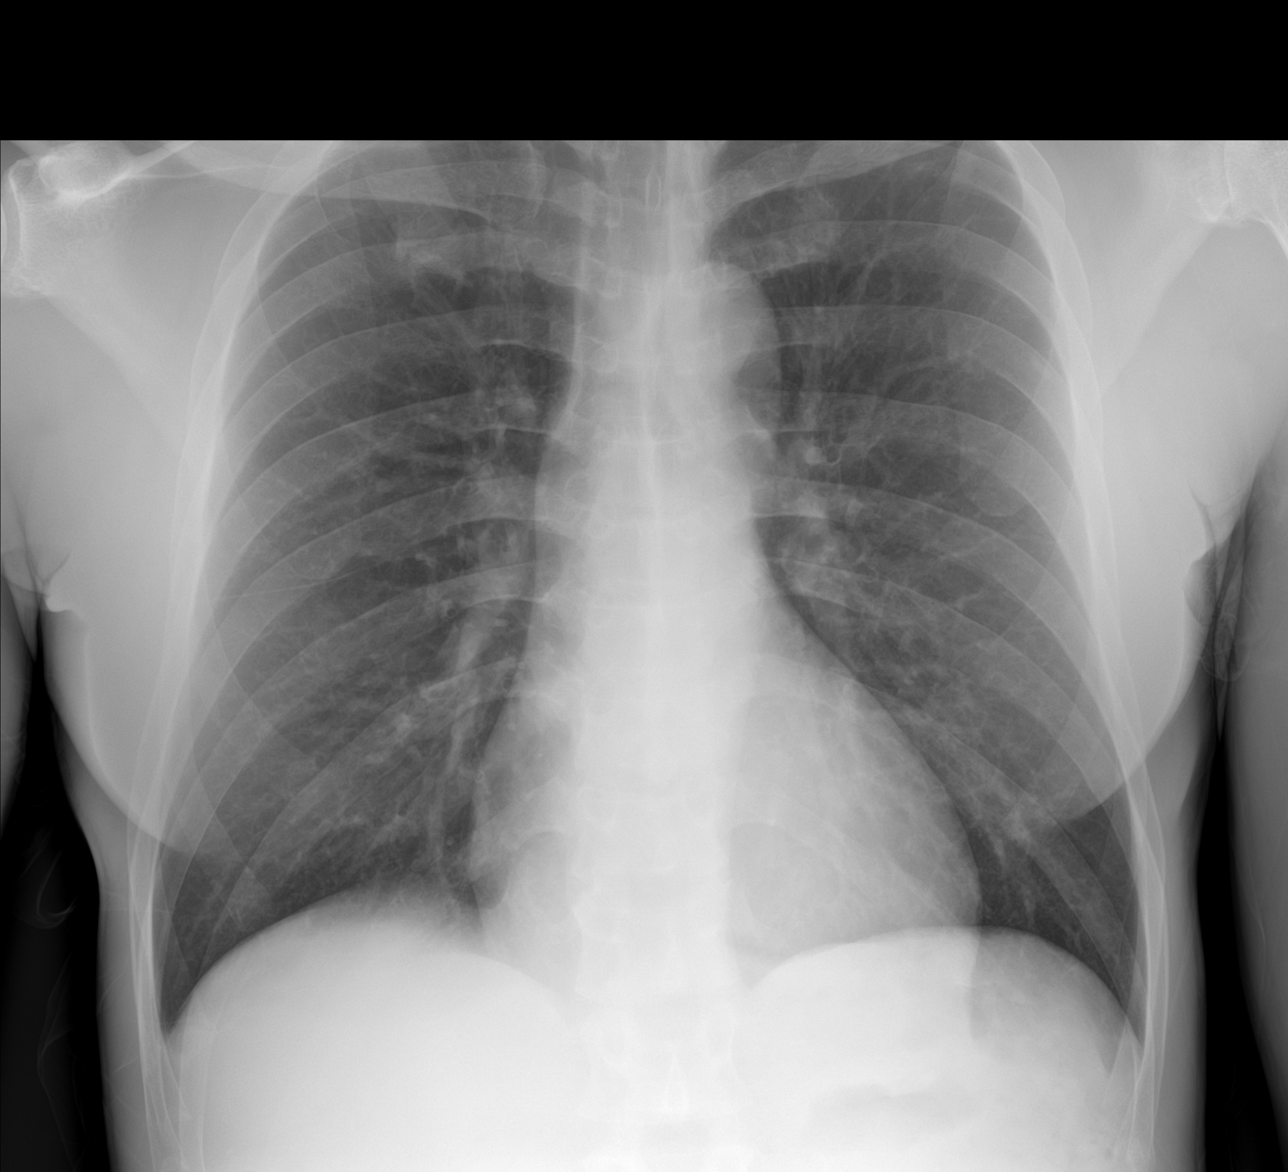

[abdomen erect ap]
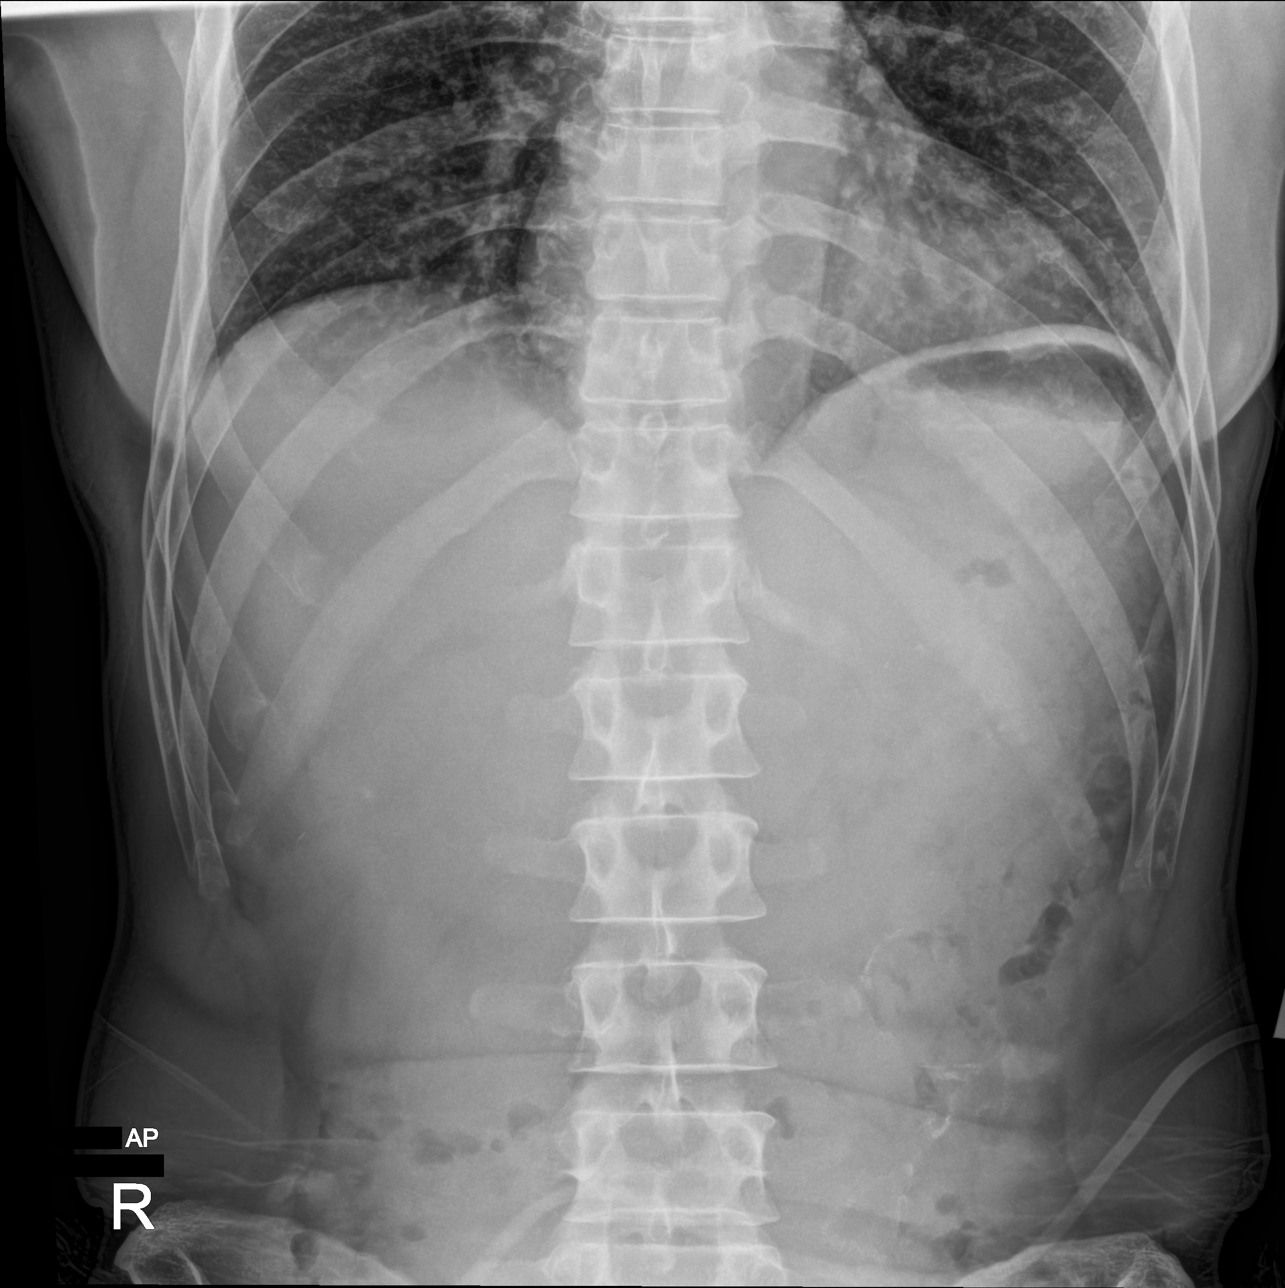

[abdomen supine]
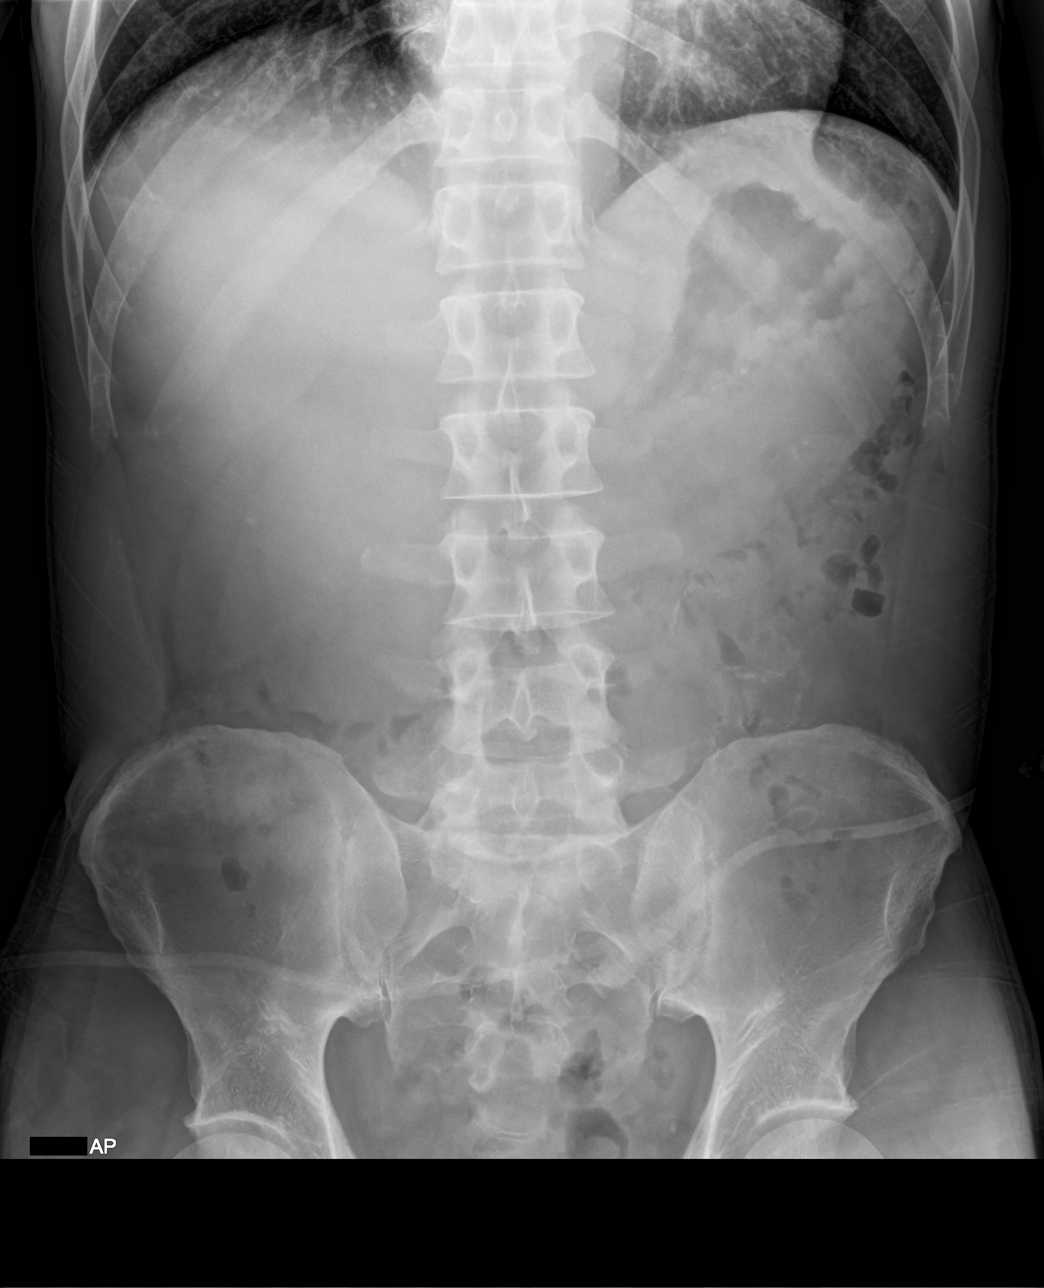

[3 of 3 positions shown; findings below may reference images not displayed]

FINDINGS: Lungs are clear.  No pleural effusion or pneumothorax.

The heart is normal in size.

Nonobstructive bowel gas pattern.

No evidence of free air under the diaphragm on the upright view.

Suspected tiny radiodensities overlying the bilateral kidneys,
favoring nonobstructing renal calculi.

Postsurgical changes/suture lines in the left lower abdomen.

Visualized osseous structures are within normal limits.
IMPRESSION: Postsurgical changes in the left lower abdomen. Possible
nonobstructing renal calculi. No acute findings.

No acute cardiopulmonary disease.

## 2024-02-01 ENCOUNTER — Encounter: Payer: Self-pay | Admitting: Radiology

## 2024-04-14 ENCOUNTER — Encounter: Payer: Self-pay | Admitting: Radiology

## 2024-06-17 ENCOUNTER — Emergency Department (HOSPITAL_COMMUNITY)
Admission: EM | Admit: 2024-06-17 | Discharge: 2024-06-17 | Disposition: A | Discharge status: Home / Self Care | Payer: MEDICAID | Attending: Emergency Medicine | Admitting: Emergency Medicine

## 2024-06-17 ENCOUNTER — Other Ambulatory Visit: Payer: Self-pay

## 2024-06-17 ENCOUNTER — Encounter (HOSPITAL_COMMUNITY): Payer: Self-pay | Admitting: *Deleted

## 2024-06-17 ENCOUNTER — Emergency Department (HOSPITAL_COMMUNITY): Payer: MEDICAID

## 2024-06-17 DIAGNOSIS — J020 Streptococcal pharyngitis: Secondary | ICD-10-CM | POA: Insufficient documentation

## 2024-06-17 DIAGNOSIS — J029 Acute pharyngitis, unspecified: Secondary | ICD-10-CM | POA: Diagnosis present

## 2024-06-17 LAB — MONONUCLEOSIS SCREEN: Mono Screen: NEGATIVE

## 2024-06-17 LAB — COMPREHENSIVE METABOLIC PANEL WITH GFR
ALT: 15 U/L (ref 0–44)
AST: 22 U/L (ref 15–41)
Albumin: 4.6 g/dL (ref 3.5–5.0)
Alkaline Phosphatase: 109 U/L (ref 38–126)
Anion gap: 13 (ref 5–15)
BUN: 12 mg/dL (ref 6–20)
CO2: 27 mmol/L (ref 22–32)
Calcium: 9.8 mg/dL (ref 8.9–10.3)
Chloride: 97 mmol/L — ABNORMAL LOW (ref 98–111)
Creatinine, Ser: 0.94 mg/dL (ref 0.61–1.24)
GFR, Estimated: >60 mL/min
Glucose, Bld: 104 mg/dL — ABNORMAL HIGH (ref 70–99)
Potassium: 4.1 mmol/L (ref 3.5–5.1)
Sodium: 137 mmol/L (ref 135–145)
Total Bilirubin: 1.1 mg/dL (ref 0.0–1.2)
Total Protein: 8.8 g/dL — ABNORMAL HIGH (ref 6.5–8.1)

## 2024-06-17 LAB — URINALYSIS, ROUTINE W REFLEX MICROSCOPIC
Bacteria, UA: NONE SEEN
Bilirubin Urine: NEGATIVE
Glucose, UA: NEGATIVE mg/dL
Hgb urine dipstick: NEGATIVE
Ketones, ur: 20 mg/dL — AB
Leukocytes,Ua: NEGATIVE
Nitrite: NEGATIVE
Protein, ur: 100 mg/dL — AB
Specific Gravity, Urine: 1.021 (ref 1.005–1.030)
pH: 7 (ref 5.0–8.0)

## 2024-06-17 LAB — CBC WITH DIFFERENTIAL/PLATELET
Basophils Absolute: 0 K/uL (ref 0.0–0.1)
Basophils Relative: 0 %
Eosinophils Absolute: 0.5 K/uL (ref 0.0–0.5)
Eosinophils Relative: 3 %
HCT: 51.4 % (ref 39.0–52.0)
Hemoglobin: 17.5 g/dL — ABNORMAL HIGH (ref 13.0–17.0)
Lymphocytes Relative: 13 %
Lymphs Abs: 2.2 K/uL (ref 0.7–4.0)
MCH: 29.7 pg (ref 26.0–34.0)
MCHC: 34 g/dL (ref 30.0–36.0)
MCV: 87.3 fL (ref 80.0–100.0)
Monocytes Absolute: 1.2 K/uL — ABNORMAL HIGH (ref 0.1–1.0)
Monocytes Relative: 7 %
Neutro Abs: 13.2 K/uL — ABNORMAL HIGH (ref 1.7–7.7)
Neutrophils Relative %: 77 %
Platelets: 285 K/uL (ref 150–400)
RBC: 5.89 MIL/uL — ABNORMAL HIGH (ref 4.22–5.81)
RDW: 14.6 % (ref 11.5–15.5)
Smear Review: NORMAL
WBC: 17.2 K/uL — ABNORMAL HIGH (ref 4.0–10.5)
nRBC: 0 % (ref 0.0–0.2)

## 2024-06-17 LAB — URINE DRUG SCREEN
Amphetamines: NEGATIVE
Barbiturates: NEGATIVE
Benzodiazepines: NEGATIVE
Cocaine: POSITIVE — AB
Fentanyl: NEGATIVE
Methadone Scn, Ur: NEGATIVE
Opiates: NEGATIVE
Tetrahydrocannabinol: POSITIVE — AB

## 2024-06-17 LAB — GROUP A STREP BY PCR: Group A Strep by PCR: NOT DETECTED

## 2024-06-17 LAB — LIPASE, BLOOD: Lipase: 18 U/L (ref 11–51)

## 2024-06-17 MED ORDER — LIDOCAINE VISCOUS HCL 2 % MT SOLN: 10.0000 mL | Freq: Four times a day (QID) | OROMUCOSAL | 0 refills | Status: AC | PRN

## 2024-06-17 MED ORDER — LIDOCAINE VISCOUS HCL 2 % MT SOLN
15.0000 mL | Freq: Once | OROMUCOSAL | Status: AC
  Administered 2024-06-17: 15 mL via OROMUCOSAL
  Filled 2024-06-17: qty 15

## 2024-06-17 MED ORDER — ONDANSETRON HCL 4 MG/2ML IJ SOLN: 4.0000 mg | Freq: Once | INTRAMUSCULAR | Status: DC

## 2024-06-17 MED ORDER — IBUPROFEN 600 MG PO TABS: 600.0000 mg | ORAL_TABLET | Freq: Three times a day (TID) | ORAL | 0 refills | Status: AC

## 2024-06-17 MED ORDER — AMOXICILLIN 500 MG PO CAPS: 500.0000 mg | ORAL_CAPSULE | Freq: Three times a day (TID) | ORAL | 0 refills | Status: DC

## 2024-06-17 MED ORDER — ONDANSETRON HCL 4 MG PO TABS: 4.0000 mg | ORAL_TABLET | Freq: Three times a day (TID) | ORAL | 0 refills | Status: DC | PRN

## 2024-06-17 MED ORDER — LORAZEPAM 2 MG/ML IJ SOLN
1.0000 mg | Freq: Once | INTRAMUSCULAR | Status: AC
  Administered 2024-06-17: 1 mg via INTRAVENOUS
  Filled 2024-06-17: qty 1

## 2024-06-17 MED ORDER — ONDANSETRON HCL 4 MG/2ML IJ SOLN
4.0000 mg | Freq: Once | INTRAMUSCULAR | Status: AC
  Administered 2024-06-17: 4 mg via INTRAVENOUS
  Filled 2024-06-17: qty 2

## 2024-06-17 MED ORDER — DROPERIDOL 2.5 MG/ML IJ SOLN
0.6250 mg | Freq: Once | INTRAMUSCULAR | Status: AC
  Administered 2024-06-17: 0.625 mg via INTRAVENOUS
  Filled 2024-06-17: qty 2

## 2024-06-17 MED ORDER — SODIUM CHLORIDE 0.9 % IV BOLUS
1000.0000 mL | Freq: Once | INTRAVENOUS | Status: AC
  Administered 2024-06-17: 1000 mL via INTRAVENOUS

## 2024-06-17 MED ORDER — KETOROLAC TROMETHAMINE 30 MG/ML IJ SOLN
15.0000 mg | Freq: Once | INTRAMUSCULAR | Status: AC
  Administered 2024-06-17: 15 mg via INTRAVENOUS
  Filled 2024-06-17: qty 1

## 2024-06-17 MED ORDER — HYDROCOD POLI-CHLORPHE POLI ER 10-8 MG/5ML PO SUER
5.0000 mL | Freq: Once | ORAL | Status: AC
  Administered 2024-06-17: 5 mL via ORAL
  Filled 2024-06-17: qty 5

## 2024-06-17 NOTE — ED Triage Notes (Signed)
 Pt with sore throat and swelling on left side, started 2 days ago. Pt also with emesis and diarrhea started today.
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
## 2024-06-17 NOTE — Discharge Instructions (Signed)
 Although your strep test is negative your exam suggest that this is a strep throat infection.  I suspect your test is negative since you have had doses of amoxicillin  before you arrived today.  I am going to extend this prescription as you need a full 10 days treatment in order to fully get over this infection.  Also prescribed you medicine to help you with throat pain and your nausea.  Plan to see your doctor for recheck or return here if your symptoms persist or worsen.  It will probably take 1 to 2 days before the antibiotics really make a significant difference in your symptoms.  Be aware that you need to take the full 10 days of the antibiotic in order to for this infection to resolve.

## 2024-06-18 ENCOUNTER — Other Ambulatory Visit: Payer: Self-pay

## 2024-06-18 ENCOUNTER — Encounter (HOSPITAL_COMMUNITY): Payer: Self-pay

## 2024-06-18 ENCOUNTER — Emergency Department (HOSPITAL_COMMUNITY)
Admission: EM | Admit: 2024-06-18 | Discharge: 2024-06-18 | Disposition: A | Discharge status: Home / Self Care | Payer: MEDICAID | Attending: Emergency Medicine | Admitting: Emergency Medicine

## 2024-06-18 DIAGNOSIS — R1116 Cannabis hyperemesis syndrome: Secondary | ICD-10-CM | POA: Insufficient documentation

## 2024-06-18 DIAGNOSIS — R1084 Generalized abdominal pain: Secondary | ICD-10-CM | POA: Insufficient documentation

## 2024-06-18 DIAGNOSIS — F1299 Cannabis use, unspecified with unspecified cannabis-induced disorder: Secondary | ICD-10-CM | POA: Insufficient documentation

## 2024-06-18 DIAGNOSIS — J029 Acute pharyngitis, unspecified: Secondary | ICD-10-CM | POA: Insufficient documentation

## 2024-06-18 MED ORDER — PROCHLORPERAZINE MALEATE 10 MG PO TABS: 10.0000 mg | ORAL_TABLET | Freq: Two times a day (BID) | ORAL | 0 refills | Status: DC | PRN

## 2024-06-18 MED ORDER — DROPERIDOL 2.5 MG/ML IJ SOLN
1.2500 mg | Freq: Once | INTRAMUSCULAR | Status: AC
  Administered 2024-06-18: 1.25 mg via INTRAMUSCULAR
  Filled 2024-06-18: qty 2
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
## 2024-06-18 NOTE — ED Triage Notes (Signed)
 Pt states that he has abdominal pain and nausea that started x2 days.

## 2024-06-19 ENCOUNTER — Encounter (HOSPITAL_BASED_OUTPATIENT_CLINIC_OR_DEPARTMENT_OTHER): Payer: Self-pay

## 2024-06-19 ENCOUNTER — Emergency Department (HOSPITAL_BASED_OUTPATIENT_CLINIC_OR_DEPARTMENT_OTHER): Payer: MEDICAID

## 2024-06-19 ENCOUNTER — Other Ambulatory Visit: Payer: Self-pay

## 2024-06-19 ENCOUNTER — Inpatient Hospital Stay (HOSPITAL_BASED_OUTPATIENT_CLINIC_OR_DEPARTMENT_OTHER): Admission: EM | Admit: 2024-06-19 | Discharge: 2024-06-21 | DRG: 440 | Disposition: A | Payer: MEDICAID

## 2024-06-19 DIAGNOSIS — J029 Acute pharyngitis, unspecified: Secondary | ICD-10-CM | POA: Diagnosis present

## 2024-06-19 DIAGNOSIS — K859 Acute pancreatitis without necrosis or infection, unspecified: Secondary | ICD-10-CM | POA: Diagnosis present

## 2024-06-19 DIAGNOSIS — M5430 Sciatica, unspecified side: Secondary | ICD-10-CM | POA: Insufficient documentation

## 2024-06-19 DIAGNOSIS — J020 Streptococcal pharyngitis: Secondary | ICD-10-CM | POA: Diagnosis present

## 2024-06-19 DIAGNOSIS — N200 Calculus of kidney: Secondary | ICD-10-CM | POA: Diagnosis present

## 2024-06-19 DIAGNOSIS — Z7952 Long term (current) use of systemic steroids: Secondary | ICD-10-CM

## 2024-06-19 DIAGNOSIS — K853 Drug induced acute pancreatitis without necrosis or infection: Secondary | ICD-10-CM | POA: Diagnosis present

## 2024-06-19 DIAGNOSIS — R030 Elevated blood-pressure reading, without diagnosis of hypertension: Secondary | ICD-10-CM | POA: Diagnosis present

## 2024-06-19 DIAGNOSIS — R112 Nausea with vomiting, unspecified: Secondary | ICD-10-CM

## 2024-06-19 DIAGNOSIS — R1013 Epigastric pain: Principal | ICD-10-CM

## 2024-06-19 DIAGNOSIS — K219 Gastro-esophageal reflux disease without esophagitis: Secondary | ICD-10-CM | POA: Diagnosis present

## 2024-06-19 DIAGNOSIS — Z87891 Personal history of nicotine dependence: Secondary | ICD-10-CM | POA: Diagnosis not present

## 2024-06-19 DIAGNOSIS — Z823 Family history of stroke: Secondary | ICD-10-CM | POA: Diagnosis not present

## 2024-06-19 DIAGNOSIS — Z79899 Other long term (current) drug therapy: Secondary | ICD-10-CM

## 2024-06-19 DIAGNOSIS — Z7982 Long term (current) use of aspirin: Secondary | ICD-10-CM

## 2024-06-19 DIAGNOSIS — M533 Sacrococcygeal disorders, not elsewhere classified: Secondary | ICD-10-CM | POA: Insufficient documentation

## 2024-06-19 DIAGNOSIS — Z791 Long term (current) use of non-steroidal anti-inflammatories (NSAID): Secondary | ICD-10-CM | POA: Diagnosis not present

## 2024-06-19 DIAGNOSIS — M47812 Spondylosis without myelopathy or radiculopathy, cervical region: Secondary | ICD-10-CM | POA: Insufficient documentation

## 2024-06-19 DIAGNOSIS — F1210 Cannabis abuse, uncomplicated: Secondary | ICD-10-CM | POA: Diagnosis present

## 2024-06-19 DIAGNOSIS — F1410 Cocaine abuse, uncomplicated: Secondary | ICD-10-CM | POA: Diagnosis present

## 2024-06-19 DIAGNOSIS — R748 Abnormal levels of other serum enzymes: Secondary | ICD-10-CM

## 2024-06-19 DIAGNOSIS — M7918 Myalgia, other site: Secondary | ICD-10-CM | POA: Insufficient documentation

## 2024-06-19 LAB — CBC
HCT: 49.2 % (ref 39.0–52.0)
HCT: 42.8 % (ref 39.0–52.0)
Hemoglobin: 17.1 g/dL — ABNORMAL HIGH (ref 13.0–17.0)
Hemoglobin: 14.6 g/dL (ref 13.0–17.0)
MCH: 29.7 pg (ref 26.0–34.0)
MCH: 29.4 pg (ref 26.0–34.0)
MCHC: 34.8 g/dL (ref 30.0–36.0)
MCHC: 34.1 g/dL (ref 30.0–36.0)
MCV: 85.4 fL (ref 80.0–100.0)
MCV: 86.3 fL (ref 80.0–100.0)
Platelets: 247 K/uL (ref 150–400)
Platelets: 281 K/uL (ref 150–400)
RBC: 5.76 MIL/uL (ref 4.22–5.81)
RBC: 4.96 MIL/uL (ref 4.22–5.81)
RDW: 13.7 % (ref 11.5–15.5)
RDW: 13.8 % (ref 11.5–15.5)
WBC: 13.1 K/uL — ABNORMAL HIGH (ref 4.0–10.5)
WBC: 12.5 K/uL — ABNORMAL HIGH (ref 4.0–10.5)
nRBC: 0 % (ref 0.0–0.2)
nRBC: 0 % (ref 0.0–0.2)

## 2024-06-19 LAB — URINALYSIS, ROUTINE W REFLEX MICROSCOPIC
Bilirubin Urine: NEGATIVE
Glucose, UA: NEGATIVE mg/dL
Ketones, ur: 40 mg/dL — AB
Leukocytes,Ua: NEGATIVE
Nitrite: NEGATIVE
Protein, ur: 100 mg/dL — AB
Specific Gravity, Urine: 1.01 (ref 1.005–1.030)
pH: 8 (ref 5.0–8.0)

## 2024-06-19 LAB — RESP PANEL BY RT-PCR (RSV, FLU A&B, COVID)  RVPGX2
Influenza A by PCR: NEGATIVE
Influenza B by PCR: NEGATIVE
Resp Syncytial Virus by PCR: NEGATIVE
SARS Coronavirus 2 by RT PCR: NEGATIVE

## 2024-06-19 LAB — COMPREHENSIVE METABOLIC PANEL WITH GFR
ALT: 16 U/L (ref 0–44)
AST: 29 U/L (ref 15–41)
Albumin: 4.4 g/dL (ref 3.5–5.0)
Alkaline Phosphatase: 124 U/L (ref 38–126)
Anion gap: 15 (ref 5–15)
BUN: 20 mg/dL (ref 6–20)
CO2: 27 mmol/L (ref 22–32)
Calcium: 10.2 mg/dL (ref 8.9–10.3)
Chloride: 90 mmol/L — ABNORMAL LOW (ref 98–111)
Creatinine, Ser: 0.94 mg/dL (ref 0.61–1.24)
GFR, Estimated: >60 mL/min
Glucose, Bld: 115 mg/dL — ABNORMAL HIGH (ref 70–99)
Potassium: 4 mmol/L (ref 3.5–5.1)
Sodium: 133 mmol/L — ABNORMAL LOW (ref 135–145)
Total Bilirubin: 1.2 mg/dL (ref 0.0–1.2)
Total Protein: 8.9 g/dL — ABNORMAL HIGH (ref 6.5–8.1)

## 2024-06-19 LAB — URINALYSIS, MICROSCOPIC (REFLEX): Bacteria, UA: NONE SEEN

## 2024-06-19 LAB — CREATININE, SERUM
Creatinine, Ser: 0.88 mg/dL (ref 0.61–1.24)
GFR, Estimated: >60 mL/min

## 2024-06-19 LAB — LIPASE, BLOOD: Lipase: 238 U/L — ABNORMAL HIGH (ref 11–51)

## 2024-06-19 MED ORDER — FAMOTIDINE IN NACL 20-0.9 MG/50ML-% IV SOLN
20.0000 mg | Freq: Once | INTRAVENOUS | Status: AC
  Administered 2024-06-19: 20 mg via INTRAVENOUS
  Filled 2024-06-19: qty 50

## 2024-06-19 MED ORDER — SODIUM CHLORIDE 0.9 % IV BOLUS
1000.0000 mL | Freq: Once | INTRAVENOUS | Status: AC
  Administered 2024-06-19: 1000 mL via INTRAVENOUS

## 2024-06-19 MED ORDER — PROMETHAZINE HCL 25 MG PO TABS
12.5000 mg | ORAL_TABLET | ORAL | Status: DC | PRN
  Administered 2024-06-19 – 2024-06-20 (×2): 12.5 mg via ORAL
  Filled 2024-06-19 (×2): qty 1

## 2024-06-19 MED ORDER — ENOXAPARIN SODIUM 40 MG/0.4ML IJ SOSY
40.0000 mg | PREFILLED_SYRINGE | INTRAMUSCULAR | Status: DC
  Administered 2024-06-19 – 2024-06-20 (×2): 40 mg via SUBCUTANEOUS
  Filled 2024-06-19 (×2): qty 0.4

## 2024-06-19 MED ORDER — KETOROLAC TROMETHAMINE 30 MG/ML IJ SOLN
15.0000 mg | Freq: Four times a day (QID) | INTRAMUSCULAR | Status: AC | PRN
  Administered 2024-06-19 – 2024-06-20 (×3): 15 mg via INTRAVENOUS
  Filled 2024-06-19 (×3): qty 1

## 2024-06-19 MED ORDER — HYDROMORPHONE HCL 1 MG/ML IJ SOLN
1.0000 mg | Freq: Once | INTRAMUSCULAR | Status: AC
  Administered 2024-06-19: 1 mg via INTRAVENOUS
  Filled 2024-06-19: qty 1

## 2024-06-19 MED ORDER — OXYCODONE HCL 5 MG PO TABS
5.0000 mg | ORAL_TABLET | ORAL | Status: DC | PRN
  Administered 2024-06-20: 5 mg via ORAL
  Filled 2024-06-19: qty 1

## 2024-06-19 MED ORDER — HYDROMORPHONE HCL 1 MG/ML IJ SOLN
0.5000 mg | INTRAMUSCULAR | Status: DC | PRN
  Administered 2024-06-19 (×2): 0.5 mg via INTRAVENOUS
  Filled 2024-06-19 (×2): qty 1

## 2024-06-19 MED ORDER — IOHEXOL 300 MG/ML  SOLN
100.0000 mL | Freq: Once | INTRAMUSCULAR | Status: AC | PRN
  Administered 2024-06-19: 100 mL via INTRAVENOUS

## 2024-06-19 MED ORDER — ONDANSETRON HCL 4 MG/2ML IJ SOLN
4.0000 mg | Freq: Once | INTRAMUSCULAR | Status: DC
  Filled 2024-06-19: qty 2

## 2024-06-19 MED ORDER — CALCIUM CARBONATE ANTACID 500 MG PO CHEW
1.0000 | CHEWABLE_TABLET | Freq: Three times a day (TID) | ORAL | Status: DC | PRN
  Administered 2024-06-19 – 2024-06-20 (×3): 200 mg via ORAL
  Filled 2024-06-19 (×3): qty 1

## 2024-06-19 MED ORDER — PANTOPRAZOLE SODIUM 40 MG PO TBEC
40.0000 mg | DELAYED_RELEASE_TABLET | Freq: Every day | ORAL | Status: DC
  Administered 2024-06-20 – 2024-06-21 (×2): 40 mg via ORAL
  Filled 2024-06-19 (×2): qty 1

## 2024-06-19 MED ORDER — ONDANSETRON HCL 4 MG/2ML IJ SOLN
4.0000 mg | Freq: Once | INTRAMUSCULAR | Status: AC | PRN
  Administered 2024-06-19: 4 mg via INTRAVENOUS
  Filled 2024-06-19: qty 2

## 2024-06-19 MED ORDER — HYDRALAZINE HCL 20 MG/ML IJ SOLN
5.0000 mg | Freq: Four times a day (QID) | INTRAMUSCULAR | Status: DC | PRN
  Administered 2024-06-19 – 2024-06-20 (×2): 5 mg via INTRAVENOUS
  Filled 2024-06-19 (×2): qty 1

## 2024-06-19 MED ORDER — LACTATED RINGERS IV SOLN: INTRAVENOUS | Status: AC

## 2024-06-19 MED ORDER — ONDANSETRON HCL 4 MG/2ML IJ SOLN
4.0000 mg | Freq: Four times a day (QID) | INTRAMUSCULAR | Status: DC | PRN
  Administered 2024-06-19 – 2024-06-20 (×2): 4 mg via INTRAVENOUS
  Filled 2024-06-19 (×2): qty 2

## 2024-06-19 MED ORDER — ONDANSETRON HCL 4 MG PO TABS
4.0000 mg | ORAL_TABLET | Freq: Four times a day (QID) | ORAL | Status: DC | PRN
  Administered 2024-06-21: 4 mg via ORAL
  Filled 2024-06-19: qty 1

## 2024-06-19 MED ORDER — MORPHINE SULFATE (PF) 2 MG/ML IV SOLN
2.0000 mg | INTRAVENOUS | Status: DC | PRN
  Administered 2024-06-19 – 2024-06-20 (×3): 2 mg via INTRAVENOUS
  Filled 2024-06-19 (×3): qty 1

## 2024-06-19 MED ORDER — SODIUM CHLORIDE 0.9 % IV SOLN: INTRAVENOUS | Status: DC

## 2024-06-19 NOTE — Progress Notes (Signed)
 Plan of Care Note for accepted transfer   Patient: Rodney Carter MRN: [id number]   DOA: 06/19/2024  Facility requesting transfer: [facility]. Requesting Provider: Donnice Hughes, MD. Reason for transfer: Acute pancreatitis. Facility course:   44 year old male with a past medical history of recurrent acute pancreatitis, cannabinoid hyperemesis syndrome who has had several ER visits in the last 3 days due to abdominal pain, nausea and vomiting.  Lab work: Urinalysis, Microscopic (reflex) [[id number]]   Collected: 06/19/24 0626   Updated: 06/19/24 0839    RBC / HPF 0-5 RBC/hpf   WBC, UA 0-5 WBC/hpf   Bacteria, UA NONE SEEN   Squamous Epithelial / HPF 0-5 /HPF   Mucus PRESENT  Urinalysis, Routine w reflex microscopic -Urine, Clean Catch [[id number]] (Abnormal)   Collected: 06/19/24 0626   Updated: 06/19/24 0839   Specimen Source: Urine, Clean Catch    Color, Urine STRAW Abnormal    APPearance CLEAR   Specific Gravity, Urine 1.010   pH 8.0   Glucose, UA NEGATIVE mg/dL   Hgb urine dipstick TRACE Abnormal    Bilirubin Urine NEGATIVE   Ketones, ur 40 Abnormal  mg/dL   Protein, ur 899 Abnormal  mg/dL   Nitrite NEGATIVE   Leukocytes,Ua NEGATIVE  Resp panel by RT-PCR (RSV, Flu A&B, Covid) Anterior Nasal Swab [[id number]]   Collected: 06/19/24 0743   Updated: 06/19/24 0829   Specimen Source: Anterior Nasal Swab    SARS Coronavirus 2 by RT PCR NEGATIVE   Influenza A by PCR NEGATIVE   Influenza B by PCR NEGATIVE   Resp Syncytial Virus by PCR NEGATIVE  Lipase, blood [[id number]] (Abnormal)   Collected: 06/19/24 0626   Updated: 06/19/24 0718   Specimen Type: Blood   Specimen Source: Vein    Lipase 238 High  U/L  Comprehensive metabolic panel [[id number]] (Abnormal)   Collected: 06/19/24 0626   Updated: 06/19/24 0718   Specimen Type: Blood   Specimen Source: Vein    Sodium 133 Low  mmol/L   Potassium 4.0 mmol/L   Chloride 90 Low  mmol/L   CO2 27 mmol/L   Glucose, Bld 115 High  mg/dL    BUN 20 mg/dL   Creatinine, Ser 9.05 mg/dL   Calcium  10.2 mg/dL   Total Protein 8.9 High  g/dL   Albumin 4.4 g/dL   AST 29 U/L   ALT 16 U/L   Alkaline Phosphatase 124 U/L   Total Bilirubin 1.2 mg/dL   GFR, Estimated >39 mL/min   Anion gap 15  CBC [[id number]] (Abnormal)   Collected: 06/19/24 0626   Updated: 06/19/24 0656   Specimen Type: Blood   Specimen Source: Vein    WBC 13.1 High  K/uL   RBC 5.76 MIL/uL   Hemoglobin 17.1 High  g/dL   HCT 50.7 %   MCV 14.5 fL   MCH 29.7 pg   MCHC 34.8 g/dL   RDW 86.2 %   Platelets 247 K/uL   nRBC 0.0 %   Imaging: CT abdomen/pelvis with contrast.  FINDINGS:   LOWER CHEST: No acute abnormality.   LIVER: The liver is unremarkable.   GALLBLADDER AND BILE DUCTS: Gallbladder is unremarkable. No biliary ductal dilatation.   SPLEEN: No acute abnormality.   PANCREAS: No acute abnormality. No pseudocyst. No periinflammatory changes. No main pancreatic duct dilatation. No calcifications.   ADRENAL GLANDS: No adrenal gland nodules.   KIDNEYS, URETERS AND BLADDER: 4 mm right and 2 mm left nephrolithiasis. Stable 4 cm involved less superior renal pole hypodensity lesion  with density of 21 Hounsfield units consistent with a simple renal cyst .Simple renal cysts do not require additional follow-up unless clinically indicated due to signs/symptoms. No hydronephrosis. No perinephric or periureteral stranding. Urinary bladder is unremarkable. No filling defects of the partially visualized collecting systems on delayed imaging.   GI AND BOWEL: Small bowel post-surgical changes. Stomach demonstrates no acute abnormality. No small or large bowel thickening or dilatation. The appendix is unremarkable.   PERITONEUM AND RETROPERITONEUM: No ascites. No free air.   VASCULATURE: Aorta is normal in caliber.   LYMPH NODES: No lymphadenopathy.   REPRODUCTIVE ORGANS: No acute abnormality.   BONES AND SOFT TISSUES: Intervertebral disc  space vacuum phenomenon at the L4-L5 and L5-S1 levels, and endplate sclerosis at the L5-S1 level. Mild retrolisthesis of L5 on S1. No acute osseous abnormality. No focal soft tissue abnormality.   IMPRESSION: 1. Nonobstructive bilateral nephrolithiasis measuring up to 4 mm.   Electronically signed by: Morgane Naveau MD MD 06/19/2024 08:06 AM EST RP Workstation: [id number]  Plan of care: The patient is accepted for admission to Med-surg  unit, at [facility] or alternatively at [facility] if a bed is available due to acute pancreatitis.  Author: Alm Dorn Castor, MD 06/19/2024  Check [email or web address] for on-call coverage.  Nursing staff, Please call [facility] Admits & Consults System-Wide number on Amion as soon as patient's arrival, so appropriate admitting provider can evaluate the pt.

## 2024-06-19 NOTE — Plan of Care (Signed)

## 2024-06-19 NOTE — ED Notes (Signed)
 [facility] at bedside

## 2024-06-19 NOTE — ED Triage Notes (Signed)
 Pt presents via POV c/o abd pain with N/V. Reports seen at [facility] multiple times for the same thing without relief.

## 2024-06-19 NOTE — ED Notes (Signed)
 Pt unable to provide a urine sample at this time.

## 2024-06-19 NOTE — ED Notes (Signed)
 Pt placed on 2L [place] for O2 desaturation sustained below 90%. Pt resting comfortably in bed.

## 2024-06-19 NOTE — ED Notes (Signed)
 Pt given ice water.

## 2024-06-19 NOTE — ED Notes (Signed)
 Patient transported to CT

## 2024-06-19 NOTE — ED Notes (Signed)
 ED Provider at bedside.

## 2024-06-19 NOTE — ED Provider Notes (Signed)
 " [place] [facility] AT [facility] Provider Note   CSN: [id number] Arrival date & time: 06/19/24  9387     Patient presents with: Abdominal Pain   Rodney Carter is a 44 y.o. male in ED with several days of upper abdominal pain, nausea and vomiting.  He was seen at [facility] emergency department originally 3 days ago, January 6, then again yesterday evening, for persistent abdominal pain.  He had a negative monotest, minor leukocytosis on blood test, negative strep testing.  However at the time he was having a severe sore throat.  He was treated with amoxicillin  and ibuprofen  empirically for possible strep.  He presents back in the company of his mother with concern for persistent vomiting, severe epigastric pain.  He is having bowel movements.  He says the pain is 10 out of 10.  He reports the sore throat is gone away.   HPI     Prior to Admission medications  Medication Sig Start Date End Date Taking? Authorizing Provider  amoxicillin  (AMOXIL ) 500 MG capsule Take 1 capsule (500 mg total) by mouth 3 (three) times daily for 10 days. 06/17/24 06/27/24  Idol, Julie, PA-C  Aspirin-Salicylamide-Caffeine (ARTHRITIS STRENGTH BC POWDER PO) Take 1 packet by mouth daily as needed (mild pain).    [provider]  Capsaicin 0.1 % CREA Apply 1 Application topically in the morning, at noon, and at bedtime. 01/17/22   [provider]  cyclobenzaprine  (FLEXERIL ) 10 MG tablet Take 1 tablet (10 mg total) by mouth 2 (two) times daily as needed for muscle spasms. 11/11/22   Schutt, Marsa HERO, PA-C  dicyclomine  (BENTYL ) 20 MG tablet Take 1 every 8 hours as needed for abdominal cramps 02/23/23   Suzette Pac, MD  doxycycline  (VIBRAMYCIN ) 100 MG capsule Take 1 capsule (100 mg total) by mouth 2 (two) times daily. 01/26/23   Jerral Meth, MD  HYDROcodone -acetaminophen  (NORCO/VICODIN) 5-325 MG tablet One tablet every six hours for pain.  Limit 7 days. 12/28/21   Brenna Lin, MD  ibuprofen  (ADVIL ) 600 MG tablet Take 1 tablet (600 mg total) by mouth 3 (three) times daily. 06/17/24   Idol, Julie, PA-C  magic mouthwash (lidocaine , diphenhydrAMINE , alum & mag hydroxide) suspension Swish and spit 10 mLs 4 (four) times daily as needed (throat pain). 06/17/24   Idol, Julie, PA-C  meloxicam (MOBIC) 7.5 MG tablet Take 7.5 mg by mouth daily. 07/04/22   [provider]  naproxen  (NAPROSYN ) 500 MG tablet Take 1 tablet (500 mg total) by mouth 2 (two) times daily with a meal. 11/30/21   Brenna Lin, MD  ondansetron  (ZOFRAN ) 4 MG tablet Take 1 tablet (4 mg total) by mouth every 8 (eight) hours as needed. 06/17/24   Idol, Julie, PA-C  ondansetron  (ZOFRAN -ODT) 4 MG disintegrating tablet 4mg  ODT q4 hours prn nausea/vomit 02/23/23   Zammit, Joseph, MD  oxyCODONE -acetaminophen  (PERCOCET/ROXICET) 5-325 MG tablet Take 1 tablet by mouth every 6 (six) hours as needed for severe pain. 09/21/22   Ladora Congress, PA  pantoprazole  (PROTONIX ) 40 MG tablet Take 1 tablet (40 mg total) by mouth daily. 02/23/23   Suzette Pac, MD  predniSONE (DELTASONE) 20 MG tablet Take 20 mg by mouth as directed. 01/09/22   [provider]  prochlorperazine  (COMPAZINE ) 10 MG tablet Take 1 tablet (10 mg total) by mouth 2 (two) times daily as needed for nausea or vomiting. 06/18/24   Roselyn Carlin NOVAK, MD    Allergies: Patient has no known allergies.  Review of Systems  Updated Vital Signs BP (!) 178/106   Pulse (!) 57   Temp 98.7 F (37.1 C) (Oral)   Resp 17   SpO2 99%   Physical Exam Constitutional:      General: He is not in acute distress. HENT:     Head: Normocephalic and atraumatic.  Eyes:     Conjunctiva/sclera: Conjunctivae normal.     Pupils: Pupils are equal, round, and reactive to light.  Cardiovascular:     Rate and Rhythm: Normal rate and regular rhythm.  Pulmonary:     Effort: Pulmonary effort is normal. No respiratory distress.  Abdominal:     General: There is no  distension.     Tenderness: There is abdominal tenderness in the epigastric area.  Skin:    General: Skin is warm and dry.  Neurological:     General: No focal deficit present.     Mental Status: He is alert. Mental status is at baseline.  Psychiatric:        Mood and Affect: Mood normal.        Behavior: Behavior normal.     (all labs ordered are listed, but only abnormal results are displayed) Labs Reviewed  LIPASE, BLOOD - Abnormal; Notable for the following components:      Result Value   Lipase 238 (*)    All other components within normal limits  COMPREHENSIVE METABOLIC PANEL WITH GFR - Abnormal; Notable for the following components:   Sodium 133 (*)    Chloride 90 (*)    Glucose, Bld 115 (*)    Total Protein 8.9 (*)    All other components within normal limits  CBC - Abnormal; Notable for the following components:   WBC 13.1 (*)    Hemoglobin 17.1 (*)    All other components within normal limits  URINALYSIS, ROUTINE W REFLEX MICROSCOPIC - Abnormal; Notable for the following components:   Color, Urine STRAW (*)    Hgb urine dipstick TRACE (*)    Ketones, ur 40 (*)    Protein, ur 100 (*)    All other components within normal limits  RESP PANEL BY RT-PCR (RSV, FLU A&B, COVID)  [id number]  URINALYSIS, MICROSCOPIC (REFLEX)    EKG: None  Radiology: CT ABDOMEN PELVIS W CONTRAST Result Date: 06/19/2024 EXAM: CT ABDOMEN AND PELVIS WITH CONTRAST 06/19/2024 07:57:24 AM TECHNIQUE: CT of the abdomen and pelvis was performed with the administration of 100 mL of iohexol  (OMNIPAQUE ) 300 MG/ML solution. Multiplanar reformatted images are provided for review. Automated exposure control, iterative reconstruction, and/or weight-based adjustment of the mA/kV was utilized to reduce the radiation dose to as low as reasonably achievable. COMPARISON: None available. CLINICAL HISTORY: Pancreatitis, acute, severe. Pt presents via POV c/o abd pain with N/V FINDINGS: LOWER CHEST: No acute  abnormality. LIVER: The liver is unremarkable. GALLBLADDER AND BILE DUCTS: Gallbladder is unremarkable. No biliary ductal dilatation. SPLEEN: No acute abnormality. PANCREAS: No acute abnormality. No pseudocyst. No periinflammatory changes. No main pancreatic duct dilatation. No calcifications. ADRENAL GLANDS: No adrenal gland nodules. KIDNEYS, URETERS AND BLADDER: 4 mm right and 2 mm left nephrolithiasis. Stable 4 cm involved less superior renal pole hypodensity lesion with density of 21 Hounsfield units consistent with a simple renal cyst .Simple renal cysts do not require additional follow-up unless clinically indicated due to signs/symptoms. No hydronephrosis. No perinephric or periureteral stranding. Urinary bladder is unremarkable. No filling defects of the partially visualized collecting systems on delayed imaging. GI AND BOWEL: Small bowel  post-surgical changes. Stomach demonstrates no acute abnormality. No small or large bowel thickening or dilatation. The appendix is unremarkable. PERITONEUM AND RETROPERITONEUM: No ascites. No free air. VASCULATURE: Aorta is normal in caliber. LYMPH NODES: No lymphadenopathy. REPRODUCTIVE ORGANS: No acute abnormality. BONES AND SOFT TISSUES: Intervertebral disc space vacuum phenomenon at the L4-L5 and L5-S1 levels, and endplate sclerosis at the L5-S1 level. Mild retrolisthesis of L5 on S1. No acute osseous abnormality. No focal soft tissue abnormality. IMPRESSION: 1. Nonobstructive bilateral nephrolithiasis measuring up to 4 mm. Electronically signed by: Morgane Naveau MD MD 06/19/2024 08:06 AM EST RP Workstation: [id number]   DG Chest Portable 1 View Result Date: 06/17/2024 CLINICAL DATA:  Sore throat and left-sided swelling with vomiting and diarrhea. EXAM: PORTABLE CHEST 1 VIEW COMPARISON:  April 19, 2021 FINDINGS: The heart size and mediastinal contours are within normal limits. Both lungs are clear. The visualized skeletal structures are unremarkable. IMPRESSION:  No active disease. Electronically Signed   By: Suzen Dials M.D.   On: 06/17/2024 13:58     Procedures   Medications Ordered in the ED  HYDROmorphone  (DILAUDID ) injection 0.5 mg (0.5 mg Intravenous Given 06/19/24 1048)  0.9 %  sodium chloride  infusion ( Intravenous New Bag/Given 06/19/24 1048)  ketorolac  (TORADOL ) 30 MG/ML injection 15 mg (has no administration in time range)  sodium chloride  0.9 % bolus 1,000 mL (0 mLs Intravenous Stopped 06/19/24 0745)  ondansetron  (ZOFRAN ) injection 4 mg (4 mg Intravenous Given 06/19/24 0654)  HYDROmorphone  (DILAUDID ) injection 1 mg (1 mg Intravenous Given 06/19/24 0737)  sodium chloride  0.9 % bolus 1,000 mL (0 mLs Intravenous Stopped 06/19/24 0900)  famotidine  (PEPCID ) IVPB 20 mg premix (0 mg Intravenous Stopped 06/19/24 0812)  iohexol  (OMNIPAQUE ) 300 MG/ML solution 100 mL (100 mLs Intravenous Contrast Given 06/19/24 0751)    Clinical Course as of 06/19/24 1102  Thu Jun 19, 2024  9047 Patient continues having persistent pain and difficulty tolerating p.o. and fluids.  We discussed his workup and findings, including elevated lipase level, ketones and protein in the urine.  We will plan at this point for admission for pain control, IV fluids for possible mild reactive pancreatitis.  No emergent findings on CT imaging.  Patient did have some brief transient hypoxia after Dilaudid  given, likely related to narcosis, but improved with awakening and inspiratory effort. [MT]  1013 Admitted to Dr celinda hospitalist -patient and family understand that there are no available beds at this time at our hospital and there may be a prolonged boarding stay in the ED.  Will continue to address his hydration and pain and nausea needs while in the ED, and if he does have improvement of the symptoms well enough to go home, that would be a reasonable option too [MT]    Clinical Course User Index [MT] Wateen Varon, Donnice PARAS, MD                                 Medical Decision Making Amount  and/or Complexity of Data Reviewed Labs: ordered. Radiology: ordered.  Risk Prescription drug management. Decision regarding hospitalization.   This patient presents to the ED with concern for epigastric pain, nausea vomiting. This involves an extensive number of treatment options, and is a complaint that carries with it a high risk of complications and morbidity.  The differential diagnosis includes gastritis versus CHS versus gastroparesis versus pancreatitis versus biliary disease versus viral illness including influenza versus other  Co-morbidities that  complicate the patient evaluation: Prior cannabis use at risk for [facility]  Additional history obtained from patient's mother  I ordered and personally interpreted labs.  The pertinent results include: Elevated white blood cell count and lipase.  Ketones and protein noted in the urine I ordered imaging studies including CT imaging of the abdomen and pelvis I independently visualized and interpreted imaging which showed no emergent findings I agree with the radiologist interpretation   I ordered medication including IV pain and gastritis medications and IV fluids for abdominal pain and suspected mild pancreatitis  I have reviewed the patients home medicines and have made adjustments as needed  After the interventions noted above, I reevaluated the patient and found that they have: stayed the same  Social Determinants of Health: cannabis cessation counseled  Dispostion:  Patient is admitted for intractable abdominal pain and nausea control.  Please see ED course.  Given the difficult bed situation and boarding situation in our ER and hospitals, I do suspect that the patient has improvement of his symptoms while here in the ED, and tolerates p.o., he could potentially be discharged home.        Final diagnoses:  Epigastric pain  Elevated lipase  Nausea and vomiting, unspecified vomiting type    ED Discharge Orders     None           Cottie Donnice PARAS, MD 06/19/24 1105  "
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
## 2024-06-19 NOTE — Plan of Care (Signed)
   Problem: Education: Goal: Knowledge of General Education information will improve Description Including pain rating scale, medication(s)/side effects and non-pharmacologic comfort measures Outcome: Progressing

## 2024-06-20 LAB — COMPREHENSIVE METABOLIC PANEL WITH GFR
ALT: 14 U/L (ref 0–44)
AST: 28 U/L (ref 15–41)
Albumin: 3.6 g/dL (ref 3.5–5.0)
Alkaline Phosphatase: 83 U/L (ref 38–126)
Anion gap: 14 (ref 5–15)
BUN: 14 mg/dL (ref 6–20)
CO2: 22 mmol/L (ref 22–32)
Calcium: 9.1 mg/dL (ref 8.9–10.3)
Chloride: 99 mmol/L (ref 98–111)
Creatinine, Ser: 0.99 mg/dL (ref 0.61–1.24)
GFR, Estimated: >60 mL/min
Glucose, Bld: 97 mg/dL (ref 70–99)
Potassium: 4.1 mmol/L (ref 3.5–5.1)
Sodium: 134 mmol/L — ABNORMAL LOW (ref 135–145)
Total Bilirubin: 1 mg/dL (ref 0.0–1.2)
Total Protein: 7.7 g/dL (ref 6.5–8.1)

## 2024-06-20 LAB — CBC
HCT: 48.2 % (ref 39.0–52.0)
Hemoglobin: 15.9 g/dL (ref 13.0–17.0)
MCH: 29.1 pg (ref 26.0–34.0)
MCHC: 33 g/dL (ref 30.0–36.0)
MCV: 88.3 fL (ref 80.0–100.0)
Platelets: 272 K/uL (ref 150–400)
RBC: 5.46 MIL/uL (ref 4.22–5.81)
RDW: 13.8 % (ref 11.5–15.5)
WBC: 10.1 K/uL (ref 4.0–10.5)
nRBC: 0 % (ref 0.0–0.2)

## 2024-06-20 MED ORDER — OXYCODONE HCL 5 MG PO TABS
10.0000 mg | ORAL_TABLET | ORAL | Status: DC | PRN
  Administered 2024-06-20 (×2): 10 mg via ORAL
  Filled 2024-06-20 (×2): qty 2

## 2024-06-20 MED ORDER — PROCHLORPERAZINE EDISYLATE 10 MG/2ML IJ SOLN
10.0000 mg | Freq: Once | INTRAMUSCULAR | Status: AC
  Administered 2024-06-20: 10 mg via INTRAVENOUS
  Filled 2024-06-20: qty 2

## 2024-06-20 MED ORDER — HYDROMORPHONE HCL 1 MG/ML IJ SOLN
0.5000 mg | INTRAMUSCULAR | Status: DC | PRN
  Administered 2024-06-20 – 2024-06-21 (×6): 0.5 mg via INTRAVENOUS
  Filled 2024-06-20 (×7): qty 0.5

## 2024-06-20 NOTE — Plan of Care (Signed)
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
## 2024-06-20 NOTE — Plan of Care (Signed)
   Problem: Education: Goal: Knowledge of General Education information will improve Description: Including pain rating scale, medication(s)/side effects and non-pharmacologic comfort measures Outcome: Progressing   Problem: Clinical Measurements: Goal: Ability to maintain clinical measurements within normal limits will improve Outcome: Progressing

## 2024-06-21 MED ORDER — OXYCODONE HCL 5 MG PO TABS: 5.0000 mg | ORAL_TABLET | ORAL | Status: DC | PRN

## 2024-06-21 MED ORDER — ACETAMINOPHEN 500 MG PO TABS: 1000.0000 mg | ORAL_TABLET | Freq: Three times a day (TID) | ORAL | Status: AC

## 2024-06-21 MED ORDER — OXYCODONE HCL 5 MG PO TABS: 10.0000 mg | ORAL_TABLET | ORAL | Status: DC | PRN

## 2024-06-21 MED ORDER — ACETAMINOPHEN 500 MG PO TABS
1000.0000 mg | ORAL_TABLET | Freq: Three times a day (TID) | ORAL | Status: DC
  Administered 2024-06-21: 1000 mg via ORAL
  Filled 2024-06-21: qty 2

## 2024-06-21 MED ORDER — HYDROMORPHONE HCL 1 MG/ML IJ SOLN: 0.5000 mg | INTRAMUSCULAR | Status: DC | PRN

## 2024-06-21 MED ORDER — OXYCODONE-ACETAMINOPHEN 5-325 MG PO TABS: 1.0000 | ORAL_TABLET | Freq: Three times a day (TID) | ORAL | 0 refills | Status: AC | PRN
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
## 2024-06-21 NOTE — Plan of Care (Signed)

## 2024-06-21 NOTE — TOC Initial Note (Signed)
 Transition of Care [facility]) - Initial/Assessment Note    Patient Details  Name: Rodney Carter MRN: [id number] Date of Birth: [birth year]  Transition of Care [facility]) CM/SW Contact:    Sonda Manuella Quill, RN Phone Number: 06/21/2024, 6:14 PM  Clinical Narrative:                 Rodney Carter w/ pt in room; pt said he lives at home w/ his parents; he plans to return w/ their support at d/c; pt said his friend will provide transportation; he identified POC dtr Jeffry Vogelsang [phone]); insurance/PCP verified; he denied SDOH risks; pt does not have DME, HH services, or home oxygen; no IP CM needs.  Expected Discharge Plan: Home/Self Care Barriers to Discharge: No Barriers Identified   Patient Goals and CMS Choice Patient states their goals for this hospitalization and ongoing recovery are:: home          Expected Discharge Plan and Services   Discharge Planning Services: CM Consult   Living arrangements for the past 2 months: Single Family Home Expected Discharge Date: 06/21/24               DME Arranged: N/A DME Agency: NA       HH Arranged: NA HH Agency: NA        Prior Living Arrangements/Services Living arrangements for the past 2 months: Single Family Home Lives with:: Parents Patient language and need for interpreter reviewed:: Yes Do you feel safe going back to the place where you live?: Yes      Need for Family Participation in Patient Care: Yes (Comment) Care giver support system in place?: Yes (comment) Current home services:  (n/a) Criminal Activity/Legal Involvement Pertinent to Current Situation/Hospitalization: No - Comment as needed  Activities of Daily Living   ADL Screening (condition at time of admission) Independently performs ADLs?: Yes (appropriate for developmental age) Is the patient deaf or have difficulty hearing?: No Does the patient have difficulty seeing, even when wearing glasses/contacts?: No Does the patient have difficulty concentrating,  remembering, or making decisions?: No  Permission Sought/Granted Permission sought to share information with : Case Manager Permission granted to share information with : Yes, Verbal Permission Granted  Share Information with NAME: Case Manager     Permission granted to share info w Relationship: Rodney Carter (dtr) [phone]     Emotional Assessment Appearance:: Appears stated age Attitude/Demeanor/Rapport: Gracious Affect (typically observed): Accepting Orientation: : Oriented to Self, Oriented to Place, Oriented to  Time, Oriented to Situation Alcohol / Substance Use: Not Applicable Psych Involvement: No (comment)  Admission diagnosis:  Acute pancreatitis [K85.90] Epigastric pain [R10.13] Elevated lipase [R74.8] Nausea and vomiting, unspecified vomiting type [R11.2] Pancreatitis [K85.90] Patient Active Problem List   Diagnosis Date Noted   Pain of right sacroiliac joint 06/19/2024   Sciatica 06/19/2024   Myofascial pain 06/19/2024   Cervical spondylosis 06/19/2024   Acute pancreatitis 06/19/2024   Pancreatitis 06/19/2024   Helicobacter pylori gastritis 12/07/2021   H/O acute pancreatitis 12/07/2021   Abdominal pain, chronic, epigastric 12/07/2021   History of small bowel obstruction 11/01/2021   Diverticulosis 11/01/2021   Acute recurrent pancreatitis 11/01/2021   Left flank mass 09/28/2020   PCP:  Lonna Maxwell, PA-C Pharmacy:   [facility] - [place], [place] - [address] [place] [address] Phone: [phone] Fax: [phone]  [id number] [place], [place] - [address] AT [address]  [place] [address] Phone: [phone] Fax: [phone]  [id number] - [place],  - [address] AT [facility] [address] [place] [address] Phone: [phone] Fax: [phone]     Social Drivers of Health (SDOH) Social History: SDOH  Screenings   Food Insecurity: No Food Insecurity (06/21/2024)  Housing: Low Risk (06/21/2024)  Transportation Needs: No Transportation Needs (06/21/2024)  Utilities: Not At Risk (06/21/2024)  Tobacco Use: Medium Risk (06/19/2024)   SDOH Interventions: Food Insecurity Interventions: Intervention Not Indicated, Inpatient TOC Housing Interventions: Intervention Not Indicated, Inpatient TOC Transportation Interventions: Intervention Not Indicated, Inpatient TOC Utilities Interventions: Intervention Not Indicated, Inpatient TOC   Readmission Risk Interventions     No data to display
